# Patient Record
Sex: Female | Born: 1985 | Race: White | Hispanic: No | Marital: Married | Smoking: Current every day smoker
Health system: Southern US, Community
[De-identification: ages and names within clinical notes are randomized; demographics above are authoritative.]

## PROBLEM LIST (undated history)

## (undated) DIAGNOSIS — E079 Disorder of thyroid, unspecified: Secondary | ICD-10-CM

## (undated) DIAGNOSIS — N159 Renal tubulo-interstitial disease, unspecified: Secondary | ICD-10-CM

## (undated) HISTORY — PX: URETHRAL DILATION: SUR417

---
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
## 2006-09-01 ENCOUNTER — Emergency Department (HOSPITAL_COMMUNITY): Admission: EM | Admit: 2006-09-01 | Discharge: 2006-09-01 | Payer: Self-pay | Admitting: Emergency Medicine

## 2006-12-12 ENCOUNTER — Emergency Department (HOSPITAL_COMMUNITY): Admission: EM | Admit: 2006-12-12 | Discharge: 2006-12-12 | Payer: Self-pay | Admitting: Emergency Medicine

## 2006-12-23 ENCOUNTER — Emergency Department (HOSPITAL_COMMUNITY): Admission: EM | Admit: 2006-12-23 | Discharge: 2006-12-23 | Payer: Self-pay | Admitting: Emergency Medicine

## 2007-01-15 ENCOUNTER — Emergency Department (HOSPITAL_COMMUNITY): Admission: EM | Admit: 2007-01-15 | Discharge: 2007-01-15 | Payer: Self-pay | Admitting: Emergency Medicine

## 2007-01-23 ENCOUNTER — Emergency Department (HOSPITAL_COMMUNITY): Admission: EM | Admit: 2007-01-23 | Discharge: 2007-01-23 | Payer: Self-pay | Admitting: Emergency Medicine

## 2007-02-21 ENCOUNTER — Emergency Department (HOSPITAL_COMMUNITY): Admission: EM | Admit: 2007-02-21 | Discharge: 2007-02-21 | Payer: Self-pay | Admitting: Emergency Medicine

## 2007-05-27 ENCOUNTER — Emergency Department (HOSPITAL_COMMUNITY): Admission: EM | Admit: 2007-05-27 | Discharge: 2007-05-27 | Payer: Self-pay | Admitting: Emergency Medicine

## 2007-07-18 ENCOUNTER — Emergency Department (HOSPITAL_COMMUNITY): Admission: EM | Admit: 2007-07-18 | Discharge: 2007-07-18 | Payer: Self-pay | Admitting: Emergency Medicine

## 2007-09-23 ENCOUNTER — Emergency Department (HOSPITAL_COMMUNITY): Admission: EM | Admit: 2007-09-23 | Discharge: 2007-09-23 | Payer: Self-pay | Admitting: Emergency Medicine

## 2007-12-07 ENCOUNTER — Emergency Department (HOSPITAL_COMMUNITY): Admission: EM | Admit: 2007-12-07 | Discharge: 2007-12-07 | Payer: Self-pay | Admitting: Emergency Medicine

## 2008-03-12 ENCOUNTER — Emergency Department (HOSPITAL_COMMUNITY): Admission: EM | Admit: 2008-03-12 | Discharge: 2008-03-12 | Payer: Self-pay | Admitting: Emergency Medicine

## 2008-08-11 IMAGING — CR DG CHEST 2V
2 series · 2 of 2 positions shown · non-contrast
Comparison: None.

CLINICAL DATA: Cough, short of breath.
 CHEST ? 2 VIEW:

[view not recorded (1 of 2)]
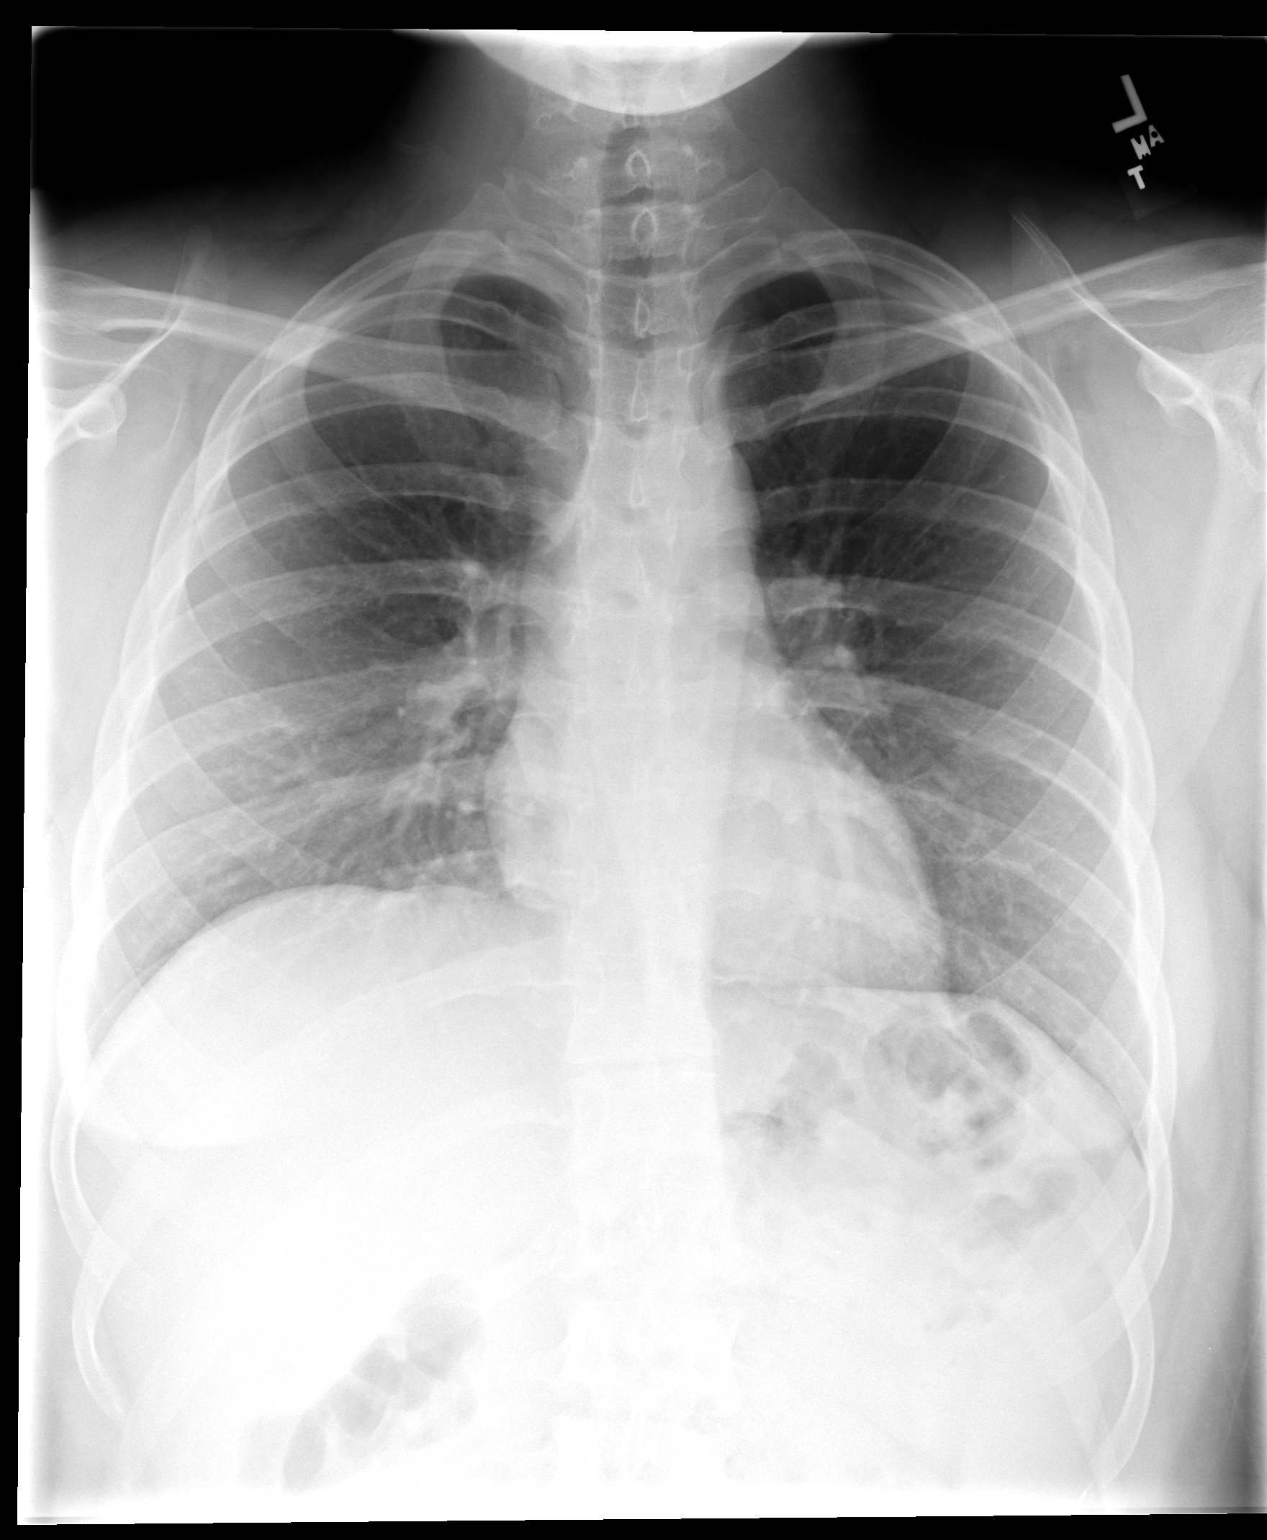

[view not recorded (2 of 2)]
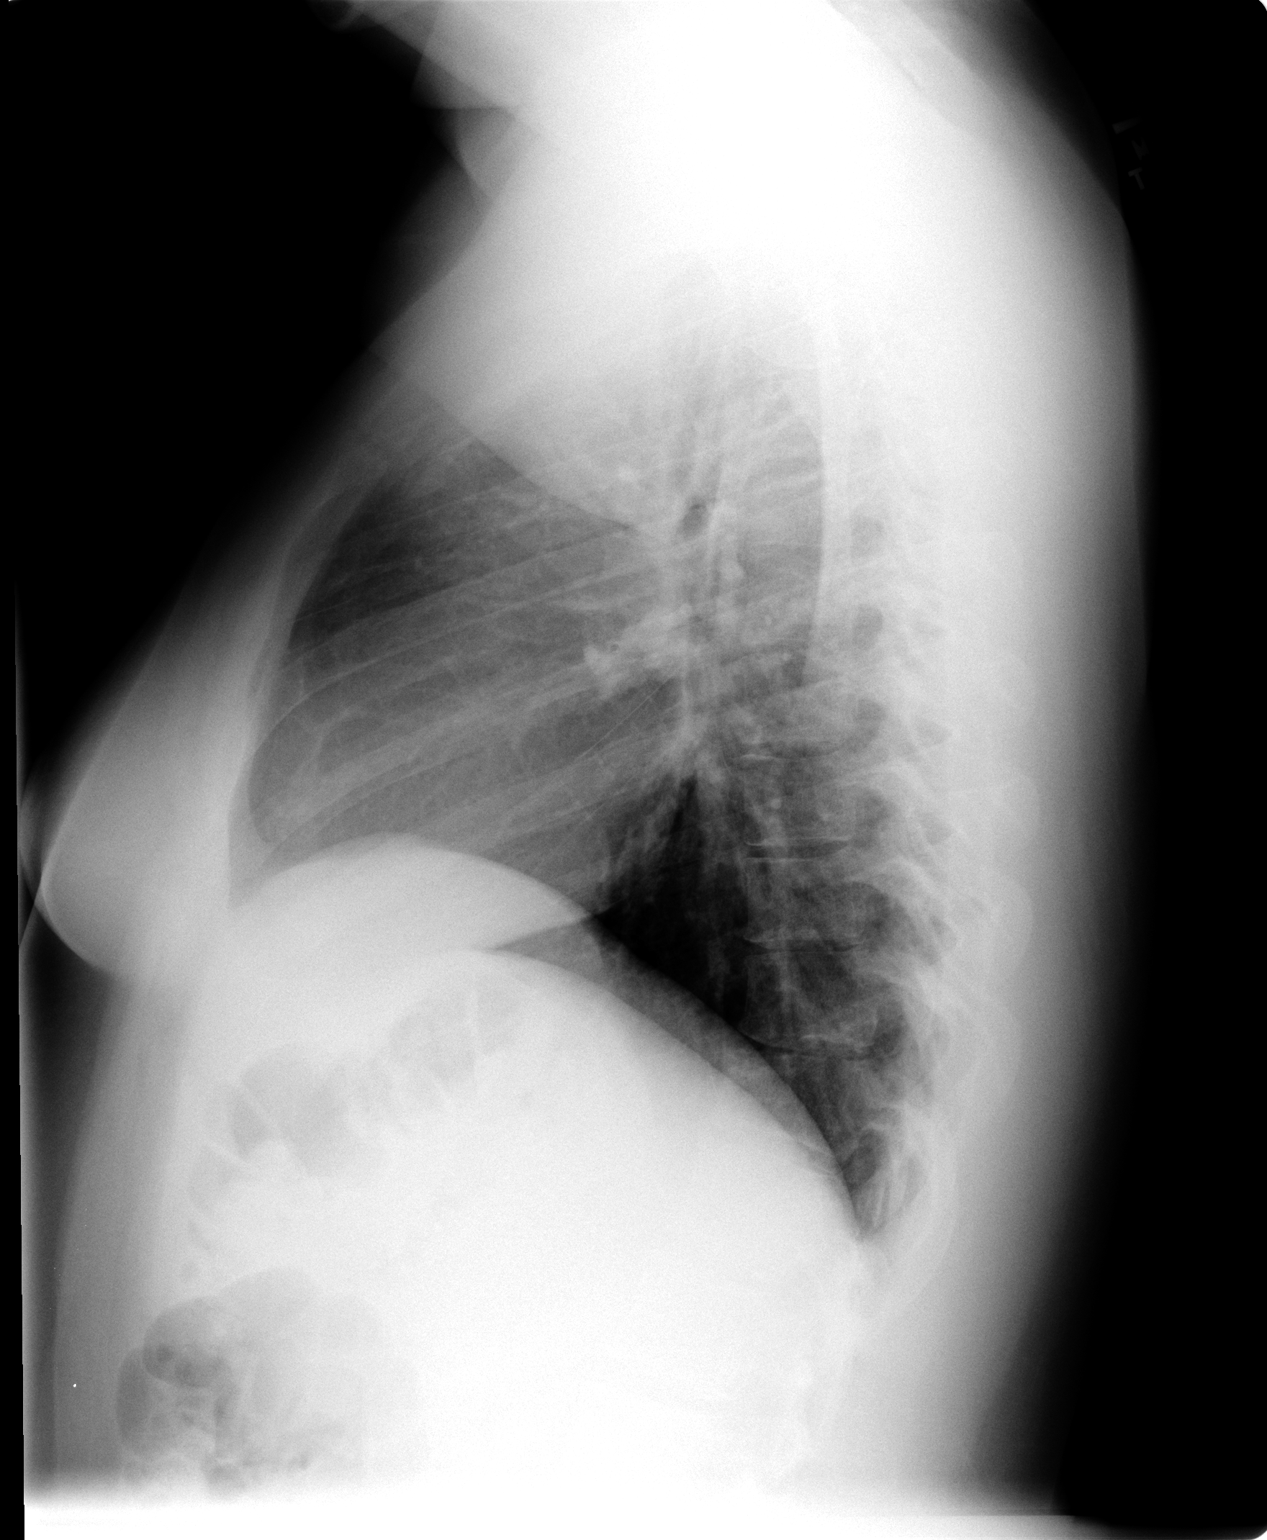

[2 of 2 positions shown; findings below may reference images not displayed]

FINDINGS: The heart size and mediastinal contours are within normal limits.  Both lungs are clear.  The visualized skeletal structures are unremarkable.
IMPRESSION: No active cardiopulmonary disease.

## 2009-01-23 ENCOUNTER — Emergency Department (HOSPITAL_COMMUNITY): Admission: EM | Admit: 2009-01-23 | Discharge: 2009-01-23 | Payer: Self-pay | Admitting: Emergency Medicine

## 2009-03-17 ENCOUNTER — Emergency Department (HOSPITAL_COMMUNITY): Admission: EM | Admit: 2009-03-17 | Discharge: 2009-03-17 | Payer: Self-pay | Admitting: Emergency Medicine

## 2009-03-22 ENCOUNTER — Emergency Department (HOSPITAL_COMMUNITY): Admission: EM | Admit: 2009-03-22 | Discharge: 2009-03-23 | Payer: Self-pay | Admitting: Emergency Medicine

## 2009-04-25 ENCOUNTER — Emergency Department (HOSPITAL_COMMUNITY): Admission: EM | Admit: 2009-04-25 | Discharge: 2009-04-25 | Payer: Self-pay | Admitting: Emergency Medicine

## 2009-04-28 ENCOUNTER — Emergency Department (HOSPITAL_COMMUNITY): Admission: EM | Admit: 2009-04-28 | Discharge: 2009-04-28 | Payer: Self-pay | Admitting: Emergency Medicine

## 2009-06-05 ENCOUNTER — Emergency Department (HOSPITAL_COMMUNITY): Admission: EM | Admit: 2009-06-05 | Discharge: 2009-06-05 | Payer: Self-pay | Admitting: Emergency Medicine

## 2009-09-19 ENCOUNTER — Emergency Department (HOSPITAL_COMMUNITY): Admission: EM | Admit: 2009-09-19 | Discharge: 2009-09-19 | Payer: Self-pay | Admitting: Emergency Medicine

## 2009-10-23 ENCOUNTER — Emergency Department (HOSPITAL_COMMUNITY): Admission: EM | Admit: 2009-10-23 | Discharge: 2009-10-23 | Payer: Self-pay | Admitting: Emergency Medicine

## 2009-11-14 ENCOUNTER — Emergency Department (HOSPITAL_COMMUNITY): Admission: EM | Admit: 2009-11-14 | Discharge: 2009-11-14 | Payer: Self-pay | Admitting: Emergency Medicine

## 2010-04-02 ENCOUNTER — Emergency Department (HOSPITAL_COMMUNITY): Admission: EM | Admit: 2010-04-02 | Discharge: 2010-04-02 | Payer: Self-pay | Admitting: Emergency Medicine

## 2010-05-12 ENCOUNTER — Emergency Department (HOSPITAL_COMMUNITY): Admission: EM | Admit: 2010-05-12 | Discharge: 2010-05-12 | Payer: Self-pay | Admitting: Emergency Medicine

## 2010-09-18 ENCOUNTER — Emergency Department (HOSPITAL_COMMUNITY): Admission: EM | Admit: 2010-09-18 | Discharge: 2010-09-18 | Payer: Self-pay | Admitting: Emergency Medicine

## 2011-01-31 ENCOUNTER — Encounter (HOSPITAL_COMMUNITY): Payer: Self-pay | Admitting: Radiology

## 2011-01-31 ENCOUNTER — Emergency Department (HOSPITAL_COMMUNITY)
Admission: EM | Admit: 2011-01-31 | Discharge: 2011-01-31 | Disposition: A | Discharge status: Home / Self Care | Payer: Managed Care, Other (non HMO) | Attending: Emergency Medicine | Admitting: Emergency Medicine

## 2011-01-31 ENCOUNTER — Emergency Department (HOSPITAL_COMMUNITY): Payer: Managed Care, Other (non HMO)

## 2011-01-31 DIAGNOSIS — J40 Bronchitis, not specified as acute or chronic: Secondary | ICD-10-CM | POA: Insufficient documentation

## 2011-01-31 LAB — CBC
HCT: 41.1 % (ref 36.0–46.0)
Hemoglobin: 13.7 g/dL (ref 12.0–15.0)
MCH: 26.8 pg (ref 26.0–34.0)
MCHC: 33.3 g/dL (ref 30.0–36.0)

## 2011-01-31 LAB — BASIC METABOLIC PANEL
BUN: 10 mg/dL (ref 6–23)
CO2: 26 mEq/L (ref 19–32)
Calcium: 8.7 mg/dL (ref 8.4–10.5)
Creatinine, Ser: 1.09 mg/dL (ref 0.4–1.2)
Glucose, Bld: 87 mg/dL (ref 70–99)

## 2011-01-31 LAB — DIFFERENTIAL
Basophils Relative: 0 % (ref 0–1)
Monocytes Absolute: 0.2 10*3/uL (ref 0.1–1.0)
Monocytes Relative: 6 % (ref 3–12)
Neutro Abs: 2.3 10*3/uL (ref 1.7–7.7)

## 2011-01-31 LAB — URINALYSIS, ROUTINE W REFLEX MICROSCOPIC: Urobilinogen, UA: 0.2 mg/dL (ref 0.0–1.0)

## 2011-02-02 LAB — URINE CULTURE
Colony Count: 25000
Culture  Setup Time: 201203020130

## 2011-03-06 LAB — URINALYSIS, ROUTINE W REFLEX MICROSCOPIC
Glucose, UA: NEGATIVE mg/dL
Hgb urine dipstick: NEGATIVE
Ketones, ur: NEGATIVE mg/dL
Protein, ur: NEGATIVE mg/dL

## 2011-03-07 LAB — CULTURE, ROUTINE-ABSCESS

## 2011-03-12 LAB — BASIC METABOLIC PANEL
BUN: 7 mg/dL (ref 6–23)
CO2: 27 mEq/L (ref 19–32)
Calcium: 9.1 mg/dL (ref 8.4–10.5)
Creatinine, Ser: 0.83 mg/dL (ref 0.4–1.2)
GFR calc Af Amer: >60 mL/min (ref >60)
Glucose, Bld: 109 mg/dL — ABNORMAL HIGH (ref 70–99)

## 2011-03-12 LAB — URINALYSIS, ROUTINE W REFLEX MICROSCOPIC
Glucose, UA: NEGATIVE mg/dL
Nitrite: NEGATIVE
Protein, ur: 100 mg/dL — AB

## 2011-03-12 LAB — URINE CULTURE: Colony Count: 40000

## 2011-03-12 LAB — CBC
MCHC: 35.6 g/dL (ref 30.0–36.0)
MCV: 80.4 fL (ref 78.0–100.0)
Platelets: 308 10*3/uL (ref 150–400)
RBC: 4.55 MIL/uL (ref 3.87–5.11)
RDW: 14.1 % (ref 11.5–15.5)

## 2011-03-12 LAB — DIFFERENTIAL
Basophils Absolute: 0.1 10*3/uL (ref 0.0–0.1)
Basophils Relative: 1 % (ref 0–1)
Eosinophils Absolute: 0.1 10*3/uL (ref 0.0–0.7)
Neutro Abs: 9.6 10*3/uL — ABNORMAL HIGH (ref 1.7–7.7)
Neutrophils Relative %: 79 % — ABNORMAL HIGH (ref 43–77)

## 2011-03-12 LAB — PREGNANCY, URINE: Preg Test, Ur: NEGATIVE

## 2011-03-12 LAB — URINE MICROSCOPIC-ADD ON

## 2011-03-13 LAB — RAPID STREP SCREEN (MED CTR MEBANE ONLY)
Streptococcus, Group A Screen (Direct): NEGATIVE
Streptococcus, Group A Screen (Direct): NEGATIVE

## 2011-03-19 LAB — URINALYSIS, ROUTINE W REFLEX MICROSCOPIC
Bilirubin Urine: NEGATIVE
Ketones, ur: NEGATIVE mg/dL
Nitrite: NEGATIVE
Urobilinogen, UA: 0.2 mg/dL (ref 0.0–1.0)

## 2011-03-19 LAB — PREGNANCY, URINE: Preg Test, Ur: NEGATIVE

## 2011-03-19 LAB — URINE CULTURE: Colony Count: 30000

## 2011-03-19 LAB — URINE MICROSCOPIC-ADD ON

## 2011-07-13 ENCOUNTER — Emergency Department (HOSPITAL_COMMUNITY)
Admission: EM | Admit: 2011-07-13 | Discharge: 2011-07-13 | Disposition: A | Discharge status: Home / Self Care | Payer: Managed Care, Other (non HMO) | Attending: Emergency Medicine | Admitting: Emergency Medicine

## 2011-07-13 ENCOUNTER — Encounter (HOSPITAL_COMMUNITY): Payer: Self-pay | Admitting: *Deleted

## 2011-07-13 DIAGNOSIS — Z87440 Personal history of urinary (tract) infections: Secondary | ICD-10-CM | POA: Insufficient documentation

## 2011-07-13 DIAGNOSIS — R109 Unspecified abdominal pain: Secondary | ICD-10-CM

## 2011-07-13 DIAGNOSIS — R1031 Right lower quadrant pain: Secondary | ICD-10-CM | POA: Insufficient documentation

## 2011-07-13 DIAGNOSIS — N159 Renal tubulo-interstitial disease, unspecified: Secondary | ICD-10-CM | POA: Insufficient documentation

## 2011-07-13 DIAGNOSIS — R10A1 Flank pain, right side: Secondary | ICD-10-CM

## 2011-07-13 DIAGNOSIS — R509 Fever, unspecified: Secondary | ICD-10-CM | POA: Insufficient documentation

## 2011-07-13 DIAGNOSIS — R300 Dysuria: Secondary | ICD-10-CM | POA: Insufficient documentation

## 2011-07-13 DIAGNOSIS — M549 Dorsalgia, unspecified: Secondary | ICD-10-CM | POA: Insufficient documentation

## 2011-07-13 HISTORY — DX: Renal tubulo-interstitial disease, unspecified: N15.9

## 2011-07-13 LAB — URINALYSIS, ROUTINE W REFLEX MICROSCOPIC
Bilirubin Urine: NEGATIVE
Glucose, UA: NEGATIVE mg/dL
Ketones, ur: NEGATIVE mg/dL
Leukocytes, UA: NEGATIVE
Nitrite: NEGATIVE
Protein, ur: NEGATIVE mg/dL
pH: 5.5 (ref 5.0–8.0)

## 2011-07-13 MED ORDER — ONDANSETRON HCL 4 MG PO TABS: 4.0000 mg | ORAL_TABLET | Freq: Four times a day (QID) | ORAL | Status: AC

## 2011-07-13 MED ORDER — HYDROCODONE-ACETAMINOPHEN 5-325 MG PO TABS: 1.0000 | ORAL_TABLET | ORAL | Status: AC | PRN

## 2011-07-13 MED ORDER — HYDROCODONE-ACETAMINOPHEN 5-325 MG PO TABS
2.0000 | ORAL_TABLET | Freq: Once | ORAL | Status: AC
  Administered 2011-07-13: 2 via ORAL
  Filled 2011-07-13: qty 2

## 2011-07-13 NOTE — ED Notes (Signed)
Fever, lower back pain, pain with urination that started Wednesday, nausea,

## 2011-07-13 NOTE — ED Notes (Signed)
I and O cath done, did have some difficulty  with procedure, pt tolerated well,

## 2011-07-13 NOTE — ED Provider Notes (Signed)
Scribed for No att. providers found, the patient was seen in room 5. This chart was scribed by Jannette Fogo. This patient's care was started at 12:50.    CSN: [id number] Arrival date & time: 07/13/2011 10:49 AM  Chief Complaint  Patient presents with  . Fever  . Back Pain  . Urinary Tract Infection   HPI Erika [name] is a 25 y.o. female with a history of multiple UTIs and pyelonephritis, who presents to the Emergency Department complaining of 4 days of right flank pain with associated nausea, chills, and subjective fever. Patient developed right flank pain at 03:00AM on Wednesday 07/10/11 and it has been constat since. She reports two days of nausea and diaphoresis at night. States flank pain and low pelvic pain is aggravated when she holds her urine. She also reports pain exacerbation with movement and ambulation. Patient denies any associates dysuria, urinary burning, stinging sensation, or vaginal discharge. She is currently on her menstrual period. There are no other associated symptoms and no other alleviating or aggravating factors.     Past Medical History  Diagnosis Date  . Asthma   . Infections of kidney   . Infections of kidney   . Kidney infection     Past Surgical History  Procedure Date  . Urethral dilation    MEDICATIONS:  Previous Medications   DULOXETINE (CYMBALTA) 60 MG CAPSULE    Take 60 mg by mouth daily.     IBUPROFEN (ADVIL,MOTRIN) 200 MG TABLET    Take 800 mg by mouth every 6 (six) hours as needed. For back pain    LEVOTHYROXINE (SYNTHROID, LEVOTHROID) 50 MCG TABLET    Take 50 mcg by mouth daily.     NAPROXEN SODIUM (ALEVE) 220 MG TABLET    Take 440 mg by mouth daily as needed. For headaches      ALLERGIES:  Allergies as of 07/13/2011 - Review Complete 07/13/2011  Allergen Reaction Noted  . Bee venom Swelling 07/13/2011     FAMILY HISTORY:  No Pertinent Family History   History  Substance Use Topics  . Smoking status: Current Everyday Smoker  .  Smokeless tobacco: Not on file  . Alcohol Use: No    Review of Systems  Constitutional: Fever: subjective   Genitourinary: Positive for flank pain, vaginal bleeding (on menses) and pelvic pain. Negative for dysuria, urgency, frequency, hematuria, decreased urine volume and vaginal discharge.  All other systems reviewed and are negative.    Physical Exam  BP 130/93  Pulse 96  Temp(Src) 98.5 F (36.9 C) (Oral)  Resp 19  Ht 5\' 4"  (1.626 m)  Wt 233 lb (105.688 kg)  BMI 39.99 kg/m2  SpO2 97%  LMP 07/12/2011  Physical Exam  Constitutional: She is oriented to person, place, and time. She appears well-developed and well-nourished. No distress.  HENT:  Head: Normocephalic and atraumatic.  Mouth/Throat: Oropharynx is clear and moist.  Eyes: Conjunctivae and EOM are normal. Pupils are equal, round, and reactive to light.  Neck: Normal range of motion. Neck supple.  Cardiovascular: Normal rate, regular rhythm, normal heart sounds and intact distal pulses.   No murmur heard. Pulmonary/Chest: Effort normal and breath sounds normal.  Abdominal: Soft. Bowel sounds are normal. She exhibits no distension. There is tenderness (Slight suprapubic tenderness).       Minimal right flank tenderness .   Musculoskeletal: Normal range of motion. She exhibits no edema and no tenderness.  Neurological: She is alert and oriented to person, place, and time. Coordination  normal.  Skin: Skin is warm and dry. No rash noted.  Psychiatric: She has a normal mood and affect. Her behavior is normal.    Procedures  OTHER DATA REVIEWED: Nursing notes, vital signs, and past medical records reviewed.   DIAGNOSTIC STUDIES: Oxygen Saturation is 98% on room air, normal by my interpretation.    LABS / RADIOLOGY:  Results for orders placed during the hospital encounter of 07/13/11  URINALYSIS, ROUTINE W REFLEX MICROSCOPIC      Component Value Range   Color, Urine YELLOW  YELLOW    Appearance CLEAR  CLEAR     Specific Gravity, Urine >1.030 (*) 1.005 - 1.030    pH 5.5  5.0 - 8.0    Glucose, UA NEGATIVE  NEGATIVE (mg/dL)   Hgb urine dipstick NEGATIVE  NEGATIVE    Bilirubin Urine NEGATIVE  NEGATIVE    Ketones, ur NEGATIVE  NEGATIVE (mg/dL)   Protein, ur NEGATIVE  NEGATIVE (mg/dL)   Urobilinogen, UA 0.2  0.0 - 1.0 (mg/dL)   Nitrite NEGATIVE  NEGATIVE    Leukocytes, UA NEGATIVE  NEGATIVE   PREGNANCY, URINE      Component Value Range   Preg Test, Ur NEGATIVE      ED COURSE / COORDINATION OF CARE: 12:55 - ED physician discussed urinalysis results with the patient  14:37 - Patient stable prior to discharge.      IMPRESSION: Right flank pain    PLAN:  Home  The patient is to return the emergency department if there is any worsening of symptoms. I have reviewed the discharge instructions with the patient.    CONDITION ON DISCHARGE: Stable   MEDICATIONS GIVEN IN THE E.D.  Medications  levothyroxine (SYNTHROID, LEVOTHROID) 50 MCG tablet (not administered)  DULoxetine (CYMBALTA) 60 MG capsule (not administered)  ibuprofen (ADVIL,MOTRIN) 200 MG tablet (not administered)  naproxen sodium (ALEVE) 220 MG tablet (not administered)  HYDROcodone-acetaminophen (NORCO) 5-325 MG per tablet (not administered)  ondansetron (ZOFRAN) 4 MG tablet (not administered)  HYDROcodone-acetaminophen (NORCO) 5-325 MG per tablet 2 tablet (2 tablet Oral Given 07/13/11 1338)     DISCHARGE MEDICATIONS: New Prescriptions   HYDROCODONE-ACETAMINOPHEN (NORCO) 5-325 MG PER TABLET    Take 1-2 tablets by mouth every 4 (four) hours as needed for pain.   ONDANSETRON (ZOFRAN) 4 MG TABLET    Take 1 tablet (4 mg total) by mouth every 6 (six) hours.         Donnetta Hutching, MD 07/15/11 [phone]

## 2011-07-13 NOTE — ED Notes (Signed)
MD at bedside. 

## 2011-07-13 NOTE — ED Notes (Signed)
See triage note.

## 2011-07-13 NOTE — ED Provider Notes (Signed)
History     CSN: [id number] Arrival date & time: 07/13/2011 10:49 AM  Chief Complaint  Patient presents with  . Fever  . Back Pain  . Urinary Tract Infection   HPI  Past Medical History  Diagnosis Date  . Asthma   . Infections of kidney   . Infections of kidney   . Kidney infection     Past Surgical History  Procedure Date  . Urethral dilation     History reviewed. No pertinent family history.  History  Substance Use Topics  . Smoking status: Current Everyday Smoker  . Smokeless tobacco: Not on file  . Alcohol Use: No    OB History    Grav Para Term Preterm Abortions TAB SAB Ect Mult Living                  Review of Systems  Physical Exam  BP 126/81  Pulse 100  Temp(Src) 98.5 F (36.9 C) (Oral)  Resp 20  Ht 5\' 4"  (1.626 m)  Wt 233 lb (105.688 kg)  BMI 39.99 kg/m2  SpO2 100%  LMP 07/12/2011  Physical Exam  ED Course  Procedures  MDMpt is nontoxic;  Urine clean;  She is in nad  I personally performed the services described in this documentation, which was scribed in my presence. The recorded information has been reviewed and considered.  Donnetta Hutching, MD 07/15/11 [phone]

## 2011-08-22 LAB — URINALYSIS, ROUTINE W REFLEX MICROSCOPIC
Bilirubin Urine: NEGATIVE
Glucose, UA: NEGATIVE
Ketones, ur: NEGATIVE
Nitrite: NEGATIVE
Protein, ur: NEGATIVE
Specific Gravity, Urine: >1.03 — ABNORMAL HIGH
pH: 5.5

## 2011-08-22 LAB — URINE MICROSCOPIC-ADD ON

## 2011-08-22 LAB — URINE CULTURE: Colony Count: 25000

## 2011-09-11 LAB — URINALYSIS, ROUTINE W REFLEX MICROSCOPIC
Bilirubin Urine: NEGATIVE
Glucose, UA: NEGATIVE
pH: 6

## 2011-10-03 ENCOUNTER — Emergency Department (HOSPITAL_COMMUNITY)
Admission: EM | Admit: 2011-10-03 | Discharge: 2011-10-03 | Disposition: A | Discharge status: Home / Self Care | Payer: Managed Care, Other (non HMO) | Attending: Emergency Medicine | Admitting: Emergency Medicine

## 2011-10-03 ENCOUNTER — Emergency Department (HOSPITAL_COMMUNITY): Payer: Managed Care, Other (non HMO)

## 2011-10-03 ENCOUNTER — Encounter (HOSPITAL_COMMUNITY): Payer: Self-pay | Admitting: Emergency Medicine

## 2011-10-03 DIAGNOSIS — F17200 Nicotine dependence, unspecified, uncomplicated: Secondary | ICD-10-CM | POA: Insufficient documentation

## 2011-10-03 DIAGNOSIS — E079 Disorder of thyroid, unspecified: Secondary | ICD-10-CM | POA: Insufficient documentation

## 2011-10-03 DIAGNOSIS — J45909 Unspecified asthma, uncomplicated: Secondary | ICD-10-CM | POA: Insufficient documentation

## 2011-10-03 DIAGNOSIS — M659 Synovitis and tenosynovitis, unspecified: Secondary | ICD-10-CM | POA: Insufficient documentation

## 2011-10-03 DIAGNOSIS — M65979 Unspecified synovitis and tenosynovitis, unspecified ankle and foot: Secondary | ICD-10-CM | POA: Insufficient documentation

## 2011-10-03 HISTORY — DX: Disorder of thyroid, unspecified: E07.9

## 2011-10-03 MED ORDER — HYDROCODONE-ACETAMINOPHEN 5-325 MG PO TABS
1.0000 | ORAL_TABLET | Freq: Once | ORAL | Status: AC
  Administered 2011-10-03: 1 via ORAL
  Filled 2011-10-03: qty 1

## 2011-10-03 MED ORDER — ONDANSETRON HCL 4 MG PO TABS
4.0000 mg | ORAL_TABLET | Freq: Once | ORAL | Status: AC
  Administered 2011-10-03: 4 mg via ORAL
  Filled 2011-10-03: qty 1

## 2011-10-03 MED ORDER — DEXAMETHASONE 6 MG PO TABS: ORAL_TABLET | ORAL | Status: AC

## 2011-10-03 MED ORDER — HYDROCODONE-ACETAMINOPHEN 5-325 MG PO TABS: 1.0000 | ORAL_TABLET | ORAL | Status: AC | PRN

## 2011-10-03 MED ORDER — DEXAMETHASONE SODIUM PHOSPHATE 4 MG/ML IJ SOLN
8.0000 mg | Freq: Once | INTRAMUSCULAR | Status: AC
  Administered 2011-10-03: 8 mg via INTRAMUSCULAR
  Filled 2011-10-03: qty 2

## 2011-10-03 NOTE — ED Provider Notes (Signed)
History     CSN: [id number] Arrival date & time: 10/03/2011 10:03 AM   First MD Initiated Contact with Patient 10/03/11 1001      Chief Complaint  Patient presents with  . Foot Pain    (Consider location/radiation/quality/duration/timing/severity/associated sxs/prior treatment) HPI Comments: No hx of recent injury. Injury about 1 month ago. Pt reports she has had problem since.  Patient is a 25 y.o. female presenting with lower extremity pain. The history is provided by the patient.  Foot Pain This is a recurrent problem. The current episode started in the past 7 days. The problem occurs daily. The problem has been gradually worsening. Associated symptoms include arthralgias. Pertinent negatives include no abdominal pain, chest pain, coughing or neck pain. The symptoms are aggravated by standing and walking. She has tried NSAIDs for the symptoms. The treatment provided no relief.    Past Medical History  Diagnosis Date  . Asthma   . Infections of kidney   . Infections of kidney   . Kidney infection   . Thyroid disease     Past Surgical History  Procedure Date  . Urethral dilation     No family history on file.  History  Substance Use Topics  . Smoking status: Current Everyday Smoker  . Smokeless tobacco: Not on file  . Alcohol Use: No    OB History    Grav Para Term Preterm Abortions TAB SAB Ect Mult Living                  Review of Systems  Constitutional: Negative for activity change.       All ROS Neg except as noted in HPI  HENT: Negative for nosebleeds and neck pain.   Eyes: Negative for photophobia and discharge.  Respiratory: Negative for cough, shortness of breath and wheezing.   Cardiovascular: Negative for chest pain and palpitations.  Gastrointestinal: Negative for abdominal pain and blood in stool.  Genitourinary: Negative for dysuria, frequency and hematuria.  Musculoskeletal: Positive for arthralgias. Negative for back pain.  Skin: Negative.    Neurological: Negative for dizziness, seizures and speech difficulty.  Psychiatric/Behavioral: Negative for hallucinations and confusion.    Allergies  Bee venom  Home Medications   Current Outpatient Rx  Name Route Sig Dispense Refill  . IBUPROFEN 200 MG PO TABS Oral Take 800 mg by mouth every 6 (six) hours as needed. For back pain     . LEVOTHYROXINE SODIUM 100 MCG PO TABS Oral Take 100 mcg by mouth daily.      Marland Kitchen NAPROXEN SODIUM 220 MG PO TABS Oral Take 440 mg by mouth daily as needed. For headaches     . DEXAMETHASONE 6 MG PO TABS  1 po bid with food 12 tablet 0  . HYDROCODONE-ACETAMINOPHEN 5-325 MG PO TABS Oral Take 1 tablet by mouth every 4 (four) hours as needed for pain. 20 tablet 0    BP 115/68  Pulse 90  Temp(Src) 97.3 F (36.3 C) (Oral)  Resp 18  Ht 5\' 4"  (1.626 m)  Wt 250 lb (113.399 kg)  BMI 42.91 kg/m2  SpO2 99%  LMP 09/14/2011  Physical Exam  Nursing note and vitals reviewed. Constitutional: She is oriented to person, place, and time. She appears well-developed and well-nourished.  Non-toxic appearance.  HENT:  Head: Normocephalic.  Right Ear: Tympanic membrane and external ear normal.  Left Ear: Tympanic membrane and external ear normal.  Eyes: EOM and lids are normal. Pupils are equal, round, and reactive to  light.  Neck: Normal range of motion. Neck supple. Carotid bruit is not present.  Cardiovascular: Normal rate, regular rhythm, normal heart sounds, intact distal pulses and normal pulses.   Pulmonary/Chest: Breath sounds normal. No respiratory distress.  Abdominal: Soft. Bowel sounds are normal. There is no tenderness. There is no guarding.  Musculoskeletal: Normal range of motion.       Pain with attempted ROM of the left ankle. Mild Lateral malleolus swelling. Crepitus noted. Achilles intact. Good cap refill and sensory.  Lymphadenopathy:       Head (right side): No submandibular adenopathy present.       Head (left side): No submandibular  adenopathy present.    She has no cervical adenopathy.  Neurological: She is alert and oriented to person, place, and time. She has normal strength. No cranial nerve deficit or sensory deficit.  Skin: Skin is warm and dry.  Psychiatric: She has a normal mood and affect. Her speech is normal.    ED Course  Procedures (including critical care time)  Labs Reviewed - No data to display Dg Ankle Complete Left  10/03/2011  *RADIOLOGY REPORT*  Clinical Data: Pain and swelling for 1 month, no known injury  LEFT ANKLE COMPLETE - 3+ VIEW  Comparison: None.  Findings: The ankle joint appears normal.  No acute fracture is seen.  Alignment is normal.  IMPRESSION: Negative.  Original Report Authenticated By: Juline Patch, M.D.     1. Tendonitis of ankle       MDM  I have reviewed nursing notes, vital signs, and all appropriate lab and imaging results for this patient.        Kathie Dike, [place] 10/03/11 1224

## 2011-10-03 NOTE — ED Notes (Signed)
Pt states left foot began hurting on Sunday.  Pt reports knot on foot x 1 month.  Pt reports swelling.  Alert and oriented x 4.  NAD.  Respirations even and unlabored.

## 2011-10-03 NOTE — ED Notes (Signed)
Pt alert and oriented.  Respirations even and unlabored.  Discharge instructions reviewed with pt and pt verbalized understanding.

## 2011-10-03 NOTE — ED Provider Notes (Signed)
Medical screening examination/treatment/procedure(s) were performed by non-physician practitioner and as supervising physician I was immediately available for consultation/collaboration.  Donnetta Hutching, MD 10/03/11 1248

## 2012-02-13 ENCOUNTER — Telehealth (HOSPITAL_COMMUNITY): Payer: Self-pay | Admitting: Dietician

## 2012-02-13 NOTE — Telephone Encounter (Signed)
Received referral from Dr. Margo Aye for dx: obesity on 02/07/2012.

## 2012-02-13 NOTE — Telephone Encounter (Signed)
Spoke with pt mother, Raynelle Fanning, during telephone encounter on 02/13/12 at 3:03 PM. She reports he daughter is not available, but will give pt RD contact information to pt so pt can call and schedule appointment. Provided RD contact information to pt mother.

## 2012-02-20 NOTE — Telephone Encounter (Signed)
Sent letter to pt home via [employer] Mail in attempt to contact pt to schedule appointment.  

## 2012-02-27 NOTE — Telephone Encounter (Signed)
Sent letter to pt home via [employer] Mail in attempt to contact pt to schedule appointment.  

## 2012-03-04 NOTE — Telephone Encounter (Signed)
Pt has not responded to attempts to contact to schedule appointment. Referral filed.  

## 2012-03-18 ENCOUNTER — Other Ambulatory Visit (HOSPITAL_COMMUNITY): Payer: Self-pay | Admitting: Internal Medicine

## 2012-03-18 DIAGNOSIS — M542 Cervicalgia: Secondary | ICD-10-CM

## 2012-03-18 DIAGNOSIS — M549 Dorsalgia, unspecified: Secondary | ICD-10-CM

## 2012-03-20 ENCOUNTER — Ambulatory Visit (HOSPITAL_COMMUNITY)
Admission: RE | Admit: 2012-03-20 | Discharge: 2012-03-20 | Disposition: A | Discharge status: Home / Self Care | Payer: Managed Care, Other (non HMO) | Source: Ambulatory Visit | Attending: Internal Medicine | Admitting: Internal Medicine

## 2012-03-20 DIAGNOSIS — M549 Dorsalgia, unspecified: Secondary | ICD-10-CM

## 2012-03-20 DIAGNOSIS — M542 Cervicalgia: Secondary | ICD-10-CM

## 2012-03-20 DIAGNOSIS — M5020 Other cervical disc displacement, unspecified cervical region: Secondary | ICD-10-CM | POA: Insufficient documentation

## 2013-05-31 ENCOUNTER — Encounter (HOSPITAL_COMMUNITY): Payer: Self-pay | Admitting: *Deleted

## 2013-05-31 ENCOUNTER — Emergency Department (HOSPITAL_COMMUNITY)
Admission: EM | Admit: 2013-05-31 | Discharge: 2013-05-31 | Disposition: A | Discharge status: Home / Self Care | Payer: Self-pay | Attending: Emergency Medicine | Admitting: Emergency Medicine

## 2013-05-31 ENCOUNTER — Emergency Department (HOSPITAL_COMMUNITY): Payer: Self-pay

## 2013-05-31 DIAGNOSIS — E079 Disorder of thyroid, unspecified: Secondary | ICD-10-CM | POA: Insufficient documentation

## 2013-05-31 DIAGNOSIS — Z87448 Personal history of other diseases of urinary system: Secondary | ICD-10-CM | POA: Insufficient documentation

## 2013-05-31 DIAGNOSIS — F17200 Nicotine dependence, unspecified, uncomplicated: Secondary | ICD-10-CM | POA: Insufficient documentation

## 2013-05-31 DIAGNOSIS — R0602 Shortness of breath: Secondary | ICD-10-CM | POA: Insufficient documentation

## 2013-05-31 DIAGNOSIS — J45909 Unspecified asthma, uncomplicated: Secondary | ICD-10-CM | POA: Insufficient documentation

## 2013-05-31 DIAGNOSIS — J209 Acute bronchitis, unspecified: Secondary | ICD-10-CM | POA: Insufficient documentation

## 2013-05-31 DIAGNOSIS — Z79899 Other long term (current) drug therapy: Secondary | ICD-10-CM | POA: Insufficient documentation

## 2013-05-31 DIAGNOSIS — J310 Chronic rhinitis: Secondary | ICD-10-CM

## 2013-05-31 DIAGNOSIS — J40 Bronchitis, not specified as acute or chronic: Secondary | ICD-10-CM

## 2013-05-31 DIAGNOSIS — J309 Allergic rhinitis, unspecified: Secondary | ICD-10-CM | POA: Insufficient documentation

## 2013-05-31 DIAGNOSIS — R0789 Other chest pain: Secondary | ICD-10-CM | POA: Insufficient documentation

## 2013-05-31 MED ORDER — PREDNISONE 20 MG PO TABS: ORAL_TABLET | ORAL | Status: DC

## 2013-05-31 MED ORDER — AMOXICILLIN 500 MG PO CAPS: 500.0000 mg | ORAL_CAPSULE | Freq: Three times a day (TID) | ORAL | Status: DC

## 2013-05-31 NOTE — ED Notes (Signed)
Patient with no complaints at this time. Respirations even and unlabored. Skin warm/dry. Discharge instructions reviewed with patient at this time. Patient given opportunity to voice concerns/ask questions. Patient discharged at this time and left Emergency Department with steady gait.   

## 2013-05-31 NOTE — ED Notes (Signed)
Non productive cough x 3 days.  Denies fever.

## 2013-05-31 NOTE — ED Notes (Signed)
Sinus congestion x 1 week. 3 days ago began having sore throat in AM w/non-productive cough.  Has been using Benadryl PM and Sudafed in AM + Albuterol inhaler w/out relief of symptoms.  States she can't take Mucinex d/t size of pill.

## 2013-05-31 NOTE — ED Provider Notes (Signed)
History    This chart was scribed for Ward Givens, MD by Donne Anon, ED Scribe. This patient was seen in room APA08/APA08 and the patient's care was started at 0722.  CSN: [id number] Arrival date & time 05/31/13  0715  First MD Initiated Contact with Patient 05/31/13 [phone]     Chief Complaint  Patient presents with  . Cough    The history is provided by the patient. No language interpreter was used.   HPI Comments: Erika [name] is a 27 y.o. female with hx of asthma, who presents to the Emergency Department complaining of 3 days of a gradual onset, gradually worsening, constant, productive cough with white/yellow sputum and associated yellow/green nasal congestion. She reports associated sore throat, SOB, wheezing and chest tightness. She denies fever, nausea, vomiting or any other pain. She has had to use an albuterol in the past and has used one for this complaint, and reports moderate relief. She has also tried Financial controller. She was last on a steroid 1 year ago for her asthma. 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 Marland Kitchen   She currently smokes 1/2 pack of cigarettes per day but denies alcohol use.    Dr. Margo Aye is her PCP.   Past Medical History  Diagnosis Date  . Asthma   . Infections of kidney   . Infections of kidney   . Kidney infection   . Thyroid disease    Past Surgical History  Procedure Laterality Date  . Urethral dilation     No family history on file. History  Substance Use Topics  . Smoking status: Current Every Day Smoker    Types: Cigarettes  . Smokeless tobacco: Not on file  . Alcohol Use: No   employed  OB History   Grav Para Term Preterm Abortions TAB SAB Ect Mult Living                 Review of Systems  Constitutional: Negative for fever.  Respiratory: Positive for cough, chest tightness, shortness of breath and wheezing.   Gastrointestinal: Negative  for nausea and vomiting.    Allergies  Bee venom  Home Medications   Current Outpatient Rx  Name  Route  Sig  Dispense  Refill  . ibuprofen (ADVIL,MOTRIN) 200 MG tablet   Oral   Take 800 mg by mouth every 6 (six) hours as needed. For back pain          . levothyroxine (SYNTHROID, LEVOTHROID) 100 MCG tablet   Oral   Take 100 mcg by mouth daily.           . naproxen sodium (ALEVE) 220 MG tablet   Oral   Take 440 mg by mouth daily as needed. For headaches           BP 124/83  Pulse 88  Temp(Src) 98.3 F (36.8 C) (Oral)  Resp 18  SpO2 97%  LMP 05/03/2013  Vital signs normal    Physical Exam  Nursing note and vitals reviewed. Constitutional: She is oriented to person, place, and time. She appears well-developed and well-nourished.  Non-toxic appearance. She does not appear ill. No distress.  HENT:  Head: Normocephalic and atraumatic.  Right Ear: External ear normal.  Left Ear: External ear normal.  Nose: Nose normal. No mucosal edema or rhinorrhea.  Mouth/Throat: Oropharynx is clear and moist and mucous membranes are normal. No dental  abscesses or edematous.  Eyes: Conjunctivae and EOM are normal. Pupils are equal, round, and reactive to light.  Neck: Normal range of motion and full passive range of motion without pain. Neck supple.  Cardiovascular: Normal rate, regular rhythm and normal heart sounds.  Exam reveals no gallop and no friction rub.   No murmur heard. Pulmonary/Chest: Effort normal. No respiratory distress. She has decreased breath sounds. She has no wheezes. She has no rhonchi. She has no rales. She exhibits no tenderness and no crepitus.  Diminished breath sounds  Abdominal: Soft. Normal appearance and bowel sounds are normal. She exhibits no distension. There is no tenderness. There is no rebound and no guarding.  Musculoskeletal: Normal range of motion. She exhibits no edema and no tenderness.  Moves all extremities well.   Neurological: She is  alert and oriented to person, place, and time. She has normal strength. No cranial nerve deficit.  Skin: Skin is warm, dry and intact. No rash noted. No erythema. No pallor.  Psychiatric: She has a normal mood and affect. Her speech is normal and behavior is normal. Her mood appears not anxious.    ED Course  Procedures (including critical care time) DIAGNOSTIC STUDIES: Oxygen Saturation is 97% on RA, adequate by my interpretation.    COORDINATION OF CARE: 7:55 AM Discussed treatment plan which includes Prednisone and amoxicillin with pt at bedside and pt agreed to plan. Advised pt to use inhaler and Muscinex D. Will discharge home.   Dg Chest 2 View  05/31/2013   *RADIOLOGY REPORT*  Clinical Data: Cough, congestion for 4 days, history of smoking and asthma  CHEST - 2 VIEW  Comparison: Chest x-ray of 01/31/2011  Findings: No active infiltrate or effusion is seen.  Minimal peribronchial thickening is present.  Mediastinal contours appear stable.  The heart is within normal limits in size.  No bony abnormality is seen.  IMPRESSION: Stable chest x-ray.  Mild peribronchial thickening.   Original Report Authenticated By: Dwyane Dee, M.D.    1. Bronchitis   2. Rhinitis     Discharge Medication List as of 05/31/2013  8:04 AM    START taking these medications   Details  amoxicillin (AMOXIL) 500 MG capsule Take 1 capsule (500 mg total) by mouth 3 (three) times daily., Starting 05/31/2013, Until Discontinued, Print    predniSONE (DELTASONE) 20 MG tablet Take 3 po QD x 3d , then 2 po QD x 3d then 1 po QD x 3d, Print        Plan discharge   Devoria Albe, MD, FACEP    MDM   I personally performed the services described in this documentation, which was scribed in my presence. The recorded information has been reviewed and considered.  Devoria Albe, MD, Armando Gang    Ward Givens, MD 05/31/13 [phone]

## 2014-05-21 ENCOUNTER — Emergency Department (HOSPITAL_COMMUNITY): Payer: Self-pay

## 2014-05-21 ENCOUNTER — Encounter (HOSPITAL_COMMUNITY): Payer: Self-pay | Admitting: Emergency Medicine

## 2014-05-21 ENCOUNTER — Emergency Department (HOSPITAL_COMMUNITY)
Admission: EM | Admit: 2014-05-21 | Discharge: 2014-05-21 | Disposition: A | Discharge status: Home / Self Care | Payer: Self-pay | Attending: Emergency Medicine | Admitting: Emergency Medicine

## 2014-05-21 DIAGNOSIS — Z87448 Personal history of other diseases of urinary system: Secondary | ICD-10-CM | POA: Insufficient documentation

## 2014-05-21 DIAGNOSIS — Y929 Unspecified place or not applicable: Secondary | ICD-10-CM | POA: Insufficient documentation

## 2014-05-21 DIAGNOSIS — W208XXA Other cause of strike by thrown, projected or falling object, initial encounter: Secondary | ICD-10-CM | POA: Insufficient documentation

## 2014-05-21 DIAGNOSIS — J45909 Unspecified asthma, uncomplicated: Secondary | ICD-10-CM | POA: Insufficient documentation

## 2014-05-21 DIAGNOSIS — Z791 Long term (current) use of non-steroidal anti-inflammatories (NSAID): Secondary | ICD-10-CM | POA: Insufficient documentation

## 2014-05-21 DIAGNOSIS — S60221A Contusion of right hand, initial encounter: Secondary | ICD-10-CM

## 2014-05-21 DIAGNOSIS — S6000XA Contusion of unspecified finger without damage to nail, initial encounter: Secondary | ICD-10-CM | POA: Insufficient documentation

## 2014-05-21 DIAGNOSIS — Z79899 Other long term (current) drug therapy: Secondary | ICD-10-CM | POA: Insufficient documentation

## 2014-05-21 DIAGNOSIS — Y9389 Activity, other specified: Secondary | ICD-10-CM | POA: Insufficient documentation

## 2014-05-21 DIAGNOSIS — F17200 Nicotine dependence, unspecified, uncomplicated: Secondary | ICD-10-CM | POA: Insufficient documentation

## 2014-05-21 DIAGNOSIS — E079 Disorder of thyroid, unspecified: Secondary | ICD-10-CM | POA: Insufficient documentation

## 2014-05-21 MED ORDER — MELOXICAM 7.5 MG PO TABS: ORAL_TABLET | ORAL | Status: DC

## 2014-05-21 MED ORDER — TRAMADOL HCL 50 MG PO TABS: ORAL_TABLET | ORAL | Status: DC

## 2014-05-21 NOTE — ED Provider Notes (Signed)
Medical screening examination/treatment/procedure(s) were performed by non-physician practitioner and as supervising physician I was immediately available for consultation/collaboration.   EKG Interpretation None        Joseph L Zammit, MD 05/21/14 1930 

## 2014-05-21 NOTE — ED Notes (Signed)
Right index finger smashed, hit by falling can from cabinet

## 2014-05-21 NOTE — Discharge Instructions (Signed)
Your x-ray is negative for fracture or dislocation. Your examination is negative for any neurologic or vascular compromise. Suspect that you have a deep bruise involving your hand and in particular your index finger. Please keep the hand above your heart. Please apply ice today and tomorrow. Please use Mobic 2 times daily with food. May use with Ultram for more severe pain. This medication may cause drowsiness, please use with caution. Please see Dr. Margo AyeHall for additional evaluation and management if not improving. Hand Contusion A hand contusion is a deep bruise on your hand area. Contusions are the result of an injury that caused bleeding under the skin. The contusion may turn blue, purple, or yellow. Minor injuries will give you a painless contusion, but more severe contusions may stay painful and swollen for a few weeks. CAUSES  A contusion is usually caused by a blow, trauma, or direct force to an area of the body. SYMPTOMS   Swelling and redness of the injured area.  Discoloration of the injured area.  Tenderness and soreness of the injured area.  Pain. DIAGNOSIS  The diagnosis can be made by taking a history and performing a physical exam. An X-ray, CT scan, or MRI may be needed to determine if there were any associated injuries, such as broken bones (fractures). TREATMENT  Often, the best treatment for a hand contusion is resting, elevating, icing, and applying cold compresses to the injured area. Over-the-counter medicines may also be recommended for pain control. HOME CARE INSTRUCTIONS   Put ice on the injured area.  Put ice in a plastic bag.  Place a towel between your skin and the bag.  Leave the ice on for 15-20 minutes, 03-04 times a day.  Only take over-the-counter or prescription medicines as directed by your caregiver. Your caregiver may recommend avoiding anti-inflammatory medicines (aspirin, ibuprofen, and naproxen) for 48 hours because these medicines may increase  bruising.  If told, use an elastic wrap as directed. This can help reduce swelling. You may remove the wrap for sleeping, showering, and bathing. If your fingers become numb, cold, or blue, take the wrap off and reapply it more loosely.  Elevate your hand with pillows to reduce swelling.  Avoid overusing your hand if it is painful. SEEK IMMEDIATE MEDICAL CARE IF:   You have increased redness, swelling, or pain in your hand.  Your swelling or pain is not relieved with medicines.  You have loss of feeling in your hand or are unable to move your fingers.  Your hand turns cold or blue.  You have pain when you move your fingers.  Your hand becomes warm to the touch.  Your contusion does not improve in 2 days. MAKE SURE YOU:   Understand these instructions.  Will watch your condition.  Will get help right away if you are not doing well or get worse. Document Released: 05/10/2002 Document Revised: 08/12/2012 Document Reviewed: 05/11/2012 [facility] JerseyExit[facility] Patient Information 2015 [place], [place]. This information is not intended to replace advice given to you by your health care provider. Make sure you discuss any questions you have with your health care provider.

## 2014-05-21 NOTE — ED Provider Notes (Signed)
CSN: [id number]     Arrival date & time 05/21/14  1720 History   First MD Initiated Contact with Patient 05/21/14 1727     Chief Complaint  Patient presents with  . Finger Injury     (Consider location/radiation/quality/duration/timing/severity/associated sxs/prior Treatment) HPI Comments: Patient is a 28 year old female who presents to the emergency department with a complaint of right index finger pain. The patient states that a large can of spaghetti sauce fell on her right hand in particular the right index finger. This happened about 10:30 in the morning. The patient states that she felt she probably just had a bruise, but the swelling began to be noted not only at the finger but extending into the web space between the index finger and thumb. The patient came in to make sure she did not have a fracture or dislocation.  Patient is a 28 y.o. female presenting with hand pain. The history is provided by the patient.  Hand Pain This is a new problem. The current episode started today. Pertinent negatives include no abdominal pain, arthralgias, chest pain, coughing or neck pain.    Past Medical History  Diagnosis Date  . Asthma   . Infections of kidney   . Infections of kidney   . Kidney infection   . Thyroid disease    Past Surgical History  Procedure Laterality Date  . Urethral dilation     History reviewed. No pertinent family history. History  Substance Use Topics  . Smoking status: Current Every Day Smoker -- 1.00 packs/day    Types: Cigarettes  . Smokeless tobacco: Not on file  . Alcohol Use: No   OB History   Grav Para Term Preterm Abortions TAB SAB Ect Mult Living                 Review of Systems  Constitutional: Negative for activity change.       All ROS Neg except as noted in HPI  HENT: Negative for nosebleeds.   Eyes: Negative for photophobia and discharge.  Respiratory: Negative for cough, shortness of breath and wheezing.   Cardiovascular: Negative for  chest pain and palpitations.  Gastrointestinal: Negative for abdominal pain and blood in stool.  Genitourinary: Negative for dysuria, frequency and hematuria.  Musculoskeletal: Negative for arthralgias, back pain and neck pain.  Skin: Negative.   Neurological: Negative for dizziness, seizures and speech difficulty.  Psychiatric/Behavioral: Negative for hallucinations and confusion.      Allergies  Bee venom  Home Medications   Prior to Admission medications   Medication Sig Start Date End Date Taking? Authorizing Provider  ibuprofen (ADVIL,MOTRIN) 200 MG tablet Take 800 mg by mouth every 6 (six) hours as needed. For back pain    Yes Historical Provider, MD  levothyroxine (SYNTHROID, LEVOTHROID) 100 MCG tablet Take 100 mcg by mouth daily.     Yes Historical Provider, MD  naproxen sodium (ALEVE) 220 MG tablet Take 440 mg by mouth daily as needed. For headaches     Historical Provider, MD   BP 126/82  Pulse 114  Temp(Src) 98.6 F (37 C) (Oral)  Resp 17  SpO2 96%  LMP 05/14/2014 Physical Exam  Nursing note and vitals reviewed. Constitutional: She is oriented to person, place, and time. She appears well-developed and well-nourished.  Non-toxic appearance.  HENT:  Head: Normocephalic.  Right Ear: Tympanic membrane and external ear normal.  Left Ear: Tympanic membrane and external ear normal.  Eyes: EOM and lids are normal. Pupils are equal, round,  and reactive to light.  Neck: Normal range of motion. Neck supple. Carotid bruit is not present.  Cardiovascular: Normal rate, regular rhythm, normal heart sounds, intact distal pulses and normal pulses.   Pulmonary/Chest: Breath sounds normal. No respiratory distress.  Abdominal: Soft. Bowel sounds are normal. There is no tenderness. There is no guarding.  Musculoskeletal: Normal range of motion.  There is full range of motion of the right shoulder, elbow, and wrist. There is mild soreness but no deformity or excessive swelling  involving the anatomical snuff box on the right. There is good range of motion of the thumb, but with some soreness. There is mild-to-moderate swelling of the right index finger. More at the base than at the distal tip. Capillary refill is less than 2 seconds.  Lymphadenopathy:       Head (right side): No submandibular adenopathy present.       Head (left side): No submandibular adenopathy present.    She has no cervical adenopathy.  Neurological: She is alert and oriented to person, place, and time. She has normal strength. No cranial nerve deficit or sensory deficit.  Skin: Skin is warm and dry.  Psychiatric: She has a normal mood and affect. Her speech is normal.    ED Course  Procedures (including critical care time) Labs Review Labs Reviewed - No data to display  Imaging Review Dg Finger Index Right  05/21/2014   CLINICAL DATA:  Dropped a can of spaghetti sauce onto RIGHT index finger, finger injury  EXAM: RIGHT INDEX FINGER 2+V  COMPARISON:  RIGHT hand radiographs 05/12/2010.  FINDINGS: Osseous mineralization normal.  Mild diffuse soft tissue swelling.  Joint spaces preserved.  No acute fracture, dislocation or bone destruction.  IMPRESSION: No acute osseous abnormalities.   Electronically Signed   By: Ulyses SouthwardMark  Boles M.D.   On: 05/21/2014 18:03     EKG Interpretation None      MDM X-ray of the right index finger is negative for fracture or dislocation. There no neurovascular changes or compromise appreciated. Suspect the patient has a deep bruise involving this area. The plan at this time is for the patient to keep the hand elevated above the heart, apply ice, and use ibuprofen for mild pain, Ultram for more severe pain.    Final diagnoses:  None    *I have reviewed nursing notes, vital signs, and all appropriate lab and imaging results for this patient.Kathie Dike**    Hobson M Bryant, PA-C 05/21/14 1824

## 2014-07-20 ENCOUNTER — Encounter (HOSPITAL_COMMUNITY): Payer: Self-pay | Admitting: Emergency Medicine

## 2014-07-20 ENCOUNTER — Emergency Department (HOSPITAL_COMMUNITY)
Admission: EM | Admit: 2014-07-20 | Discharge: 2014-07-20 | Disposition: A | Discharge status: Home / Self Care | Payer: Self-pay | Attending: Emergency Medicine | Admitting: Emergency Medicine

## 2014-07-20 ENCOUNTER — Emergency Department (HOSPITAL_COMMUNITY): Payer: Self-pay

## 2014-07-20 DIAGNOSIS — Z87440 Personal history of urinary (tract) infections: Secondary | ICD-10-CM | POA: Insufficient documentation

## 2014-07-20 DIAGNOSIS — J45909 Unspecified asthma, uncomplicated: Secondary | ICD-10-CM | POA: Insufficient documentation

## 2014-07-20 DIAGNOSIS — Z79899 Other long term (current) drug therapy: Secondary | ICD-10-CM | POA: Insufficient documentation

## 2014-07-20 DIAGNOSIS — F17200 Nicotine dependence, unspecified, uncomplicated: Secondary | ICD-10-CM | POA: Insufficient documentation

## 2014-07-20 DIAGNOSIS — M5412 Radiculopathy, cervical region: Secondary | ICD-10-CM | POA: Insufficient documentation

## 2014-07-20 DIAGNOSIS — E079 Disorder of thyroid, unspecified: Secondary | ICD-10-CM | POA: Insufficient documentation

## 2014-07-20 DIAGNOSIS — M25519 Pain in unspecified shoulder: Secondary | ICD-10-CM | POA: Insufficient documentation

## 2014-07-20 DIAGNOSIS — R209 Unspecified disturbances of skin sensation: Secondary | ICD-10-CM | POA: Insufficient documentation

## 2014-07-20 MED ORDER — OXYCODONE-ACETAMINOPHEN 5-325 MG PO TABS: 1.0000 | ORAL_TABLET | ORAL | Status: DC | PRN

## 2014-07-20 MED ORDER — OXYCODONE-ACETAMINOPHEN 5-325 MG PO TABS
2.0000 | ORAL_TABLET | Freq: Once | ORAL | Status: AC
  Administered 2014-07-20: 2 via ORAL
  Filled 2014-07-20: qty 2

## 2014-07-20 NOTE — ED Notes (Signed)
Burning type pain to left shoulder, denies injury

## 2014-07-20 NOTE — ED Provider Notes (Signed)
CSN: [id number]     Arrival date & time 07/20/14  0043 History   First MD Initiated Contact with Patient 07/20/14 0148     Chief Complaint  Patient presents with  . Shoulder Pain      Patient is a 28 y.o. female presenting with shoulder pain. The history is provided by the patient.  Shoulder Pain This is a new problem. The current episode started 2 days ago. The problem occurs constantly. The problem has been gradually worsening. Pertinent negatives include no chest pain, no headaches and no shortness of breath. Exacerbated by: movement. The symptoms are relieved by rest.  pt reports onset of left neck/shoulder burning type pain two days ago Worse with palpation/movement No trauma but she reports heavy lifting recently at work She reports numbness to her left fingers only She reports due to pain she can not raise her left shoulder She denies h/o neck surgery previously  Past Medical History  Diagnosis Date  . Asthma   . Infections of kidney   . Infections of kidney   . Kidney infection   . Thyroid disease    Past Surgical History  Procedure Laterality Date  . Urethral dilation     No family history on file. History  Substance Use Topics  . Smoking status: Current Every Day Smoker -- 1.00 packs/day    Types: Cigarettes  . Smokeless tobacco: Not on file  . Alcohol Use: No   OB History   Grav Para Term Preterm Abortions TAB SAB Ect Mult Living                 Review of Systems  Constitutional: Negative for fever.  Respiratory: Negative for shortness of breath.   Cardiovascular: Negative for chest pain.  Neurological: Positive for numbness. Negative for headaches.      Allergies  Bee venom  Home Medications   Prior to Admission medications   Medication Sig Start Date End Date Taking? Authorizing Provider  ibuprofen (ADVIL,MOTRIN) 200 MG tablet Take 800 mg by mouth every 6 (six) hours as needed. For back pain    Yes Historical Provider, MD  levothyroxine  (SYNTHROID, LEVOTHROID) 100 MCG tablet Take 100 mcg by mouth daily.     Yes Historical Provider, MD  oxyCODONE-acetaminophen (PERCOCET/ROXICET) 5-325 MG per tablet Take 1 tablet by mouth every 4 (four) hours as needed for severe pain. 07/20/14   Joya Gaskinsonald W Maximina Pirozzi, MD  traMADol Janean Sark(ULTRAM) 50 MG tablet 1 or 2 po q6h prn pain 05/21/14   Kathie DikeHobson M Bryant, PA-C   BP 107/72  Pulse 88  Temp(Src) 98.3 F (36.8 C) (Oral)  Resp 16  Ht 5\' 4"  (1.626 m)  Wt 228 lb (103.42 kg)  BMI 39.12 kg/m2  SpO2 99%  LMP 07/04/2014 Physical Exam CONSTITUTIONAL: Well developed/well nourished HEAD: Normocephalic/atraumatic EYES: EOMI/PERRL ENMT: Mucous membranes moist NECK: supple no meningeal signs SPINE:entire spine nontender, cervical paraspinal tenderness.   CV: S1/S2 noted, no murmurs/rubs/gallops noted LUNGS: Lungs are clear to auscultation bilaterally, no apparent distress ABDOMEN: soft, nontender, no rebound or guarding GU:no cva tenderness NEURO: Pt is awake/alert, moves all extremitiesx4 Equal power (5/5) with hand grip, wrist flex/extension, elbow flex/extension,pt reports left fingers "tingling" but no other sensory deficit to left UE Equal (2+) biceps/brachioradialis reflex in bilateral UE Left shoulder abduction limited due to pain.   EXTREMITIES: pulses normal, full ROM, tenderness to palpation of left trapezius No warmth or erythema noted No deformity noted to left shoulder SKIN: warm, color normal PSYCH: no  abnormalities of mood noted  ED Course  Procedures  Suspect possible cervical radiculopathy I don't feel she needs emergent MRI at this time Advised pain control and f/u as outpatient We discussed strict return precautions  Imaging Review Dg Shoulder Left  07/20/2014   CLINICAL DATA:  Left shoulder pain.  EXAM: LEFT SHOULDER - 2+ VIEW  COMPARISON:  None.  FINDINGS: The joint spaces are maintained. No acute fracture. The ribs are intact. The left lung is clear.  IMPRESSION: No fracture  or dislocation.   Electronically Signed   By: Loralie Champagne M.D.   On: 07/20/2014 01:40      MDM   Final diagnoses:  Cervical radiculopathy    Nursing notes including past medical history and social history reviewed and considered in documentation xrays reviewed and considered     Joya Gaskins, MD 07/20/14 [phone]

## 2014-07-20 NOTE — Discharge Instructions (Signed)

## 2016-03-28 ENCOUNTER — Emergency Department (HOSPITAL_COMMUNITY): Payer: BLUE CROSS/BLUE SHIELD

## 2016-03-28 ENCOUNTER — Encounter (HOSPITAL_COMMUNITY): Payer: Self-pay | Admitting: Emergency Medicine

## 2016-03-28 ENCOUNTER — Emergency Department (HOSPITAL_COMMUNITY)
Admission: EM | Admit: 2016-03-28 | Discharge: 2016-03-29 | Disposition: A | Discharge status: Home / Self Care | Payer: BLUE CROSS/BLUE SHIELD | Attending: Emergency Medicine | Admitting: Emergency Medicine

## 2016-03-28 DIAGNOSIS — L03211 Cellulitis of face: Secondary | ICD-10-CM | POA: Diagnosis not present

## 2016-03-28 DIAGNOSIS — Z791 Long term (current) use of non-steroidal anti-inflammatories (NSAID): Secondary | ICD-10-CM | POA: Insufficient documentation

## 2016-03-28 DIAGNOSIS — Z79891 Long term (current) use of opiate analgesic: Secondary | ICD-10-CM | POA: Diagnosis not present

## 2016-03-28 DIAGNOSIS — F17210 Nicotine dependence, cigarettes, uncomplicated: Secondary | ICD-10-CM | POA: Insufficient documentation

## 2016-03-28 DIAGNOSIS — K122 Cellulitis and abscess of mouth: Secondary | ICD-10-CM | POA: Insufficient documentation

## 2016-03-28 DIAGNOSIS — J45909 Unspecified asthma, uncomplicated: Secondary | ICD-10-CM | POA: Insufficient documentation

## 2016-03-28 DIAGNOSIS — M272 Inflammatory conditions of jaws: Secondary | ICD-10-CM

## 2016-03-28 DIAGNOSIS — R600 Localized edema: Secondary | ICD-10-CM | POA: Diagnosis present

## 2016-03-28 LAB — I-STAT CHEM 8, ED
BUN: 12 mg/dL (ref 6–20)
CALCIUM ION: 1.25 mmol/L — AB (ref 1.12–1.23)
CHLORIDE: 103 mmol/L (ref 101–111)
CREATININE: 1 mg/dL (ref 0.44–1.00)
GLUCOSE: 92 mg/dL (ref 65–99)
HCT: 39 % (ref 36.0–46.0)
Hemoglobin: 13.3 g/dL (ref 12.0–15.0)
POTASSIUM: 3.5 mmol/L (ref 3.5–5.1)
Sodium: 142 mmol/L (ref 135–145)
TCO2: 25 mmol/L (ref 0–100)

## 2016-03-28 MED ORDER — MORPHINE SULFATE (PF) 4 MG/ML IV SOLN
4.0000 mg | INTRAVENOUS | Status: DC | PRN
  Administered 2016-03-28: 4 mg via INTRAVENOUS
  Filled 2016-03-28: qty 1

## 2016-03-28 MED ORDER — CLINDAMYCIN HCL 300 MG PO CAPS: 300.0000 mg | ORAL_CAPSULE | Freq: Three times a day (TID) | ORAL | Status: DC

## 2016-03-28 MED ORDER — DEXAMETHASONE SODIUM PHOSPHATE 10 MG/ML IJ SOLN
10.0000 mg | Freq: Once | INTRAMUSCULAR | Status: AC
  Administered 2016-03-28: 10 mg via INTRAVENOUS
  Filled 2016-03-28: qty 1

## 2016-03-28 MED ORDER — IOPAMIDOL (ISOVUE-300) INJECTION 61%
100.0000 mL | Freq: Once | INTRAVENOUS | Status: AC | PRN
  Administered 2016-03-28: 100 mL via INTRAVENOUS

## 2016-03-28 MED ORDER — ONDANSETRON HCL 4 MG/2ML IJ SOLN
4.0000 mg | Freq: Once | INTRAMUSCULAR | Status: AC
  Administered 2016-03-28: 4 mg via INTRAVENOUS
  Filled 2016-03-28: qty 2

## 2016-03-28 MED ORDER — PREDNISONE 20 MG PO TABS: 20.0000 mg | ORAL_TABLET | Freq: Two times a day (BID) | ORAL | Status: DC

## 2016-03-28 MED ORDER — CLINDAMYCIN PHOSPHATE 900 MG/50ML IV SOLN
900.0000 mg | Freq: Once | INTRAVENOUS | Status: AC
  Administered 2016-03-28: 900 mg via INTRAVENOUS
  Filled 2016-03-28: qty 50

## 2016-03-28 NOTE — ED Provider Notes (Addendum)
CSN: [id number]     Arrival date & time 03/28/16  1936 History  By signing my name below, I, Tanda Rockers, attest that this documentation has been prepared under the direction and in the presence of Rolland Porter, MD. Electronically Signed: Tanda Rockers, ED Scribe. 03/28/2016. 9:51 PM.   Chief Complaint  Patient presents with  . Oral Swelling   The history is provided by the patient. No language interpreter was used.     HPI Comments: Erika [name] is a 30 y.o. female who presents to the Emergency Department complaining of gradual onset, constant, worsening, oral and facial swelling x 2 weeks after having wisdom teeth removed. Pt reports that 2 weeks ago she had the left and right lower wisdom teeth removed. Since the surgery she has had a knot to the left jaw that has been gradually increasing in size. Pt was seen by her oral surgeon yesterday and was diagnosed with cellulitis. She was placed on Augment and told to go to the ED if the size increased or she began having sore throat. Pt currently complains of sore throat with difficulty swallowing. Denies voice change, fever, chills, or any other associated symptoms.   Past Medical History  Diagnosis Date  . Asthma   . Infections of kidney   . Infections of kidney   . Kidney infection   . Thyroid disease    Past Surgical History  Procedure Laterality Date  . Urethral dilation    . Cesarean section     No family history on file. Social History  Substance Use Topics  . Smoking status: Current Every Day Smoker -- 0.50 packs/day    Types: Cigarettes  . Smokeless tobacco: None  . Alcohol Use: No   OB History    No data available     Review of Systems  Constitutional: Negative for fever, chills, diaphoresis, appetite change and fatigue.  HENT: Positive for dental problem, facial swelling, sore throat and trouble swallowing. Negative for mouth sores.   Eyes: Negative for visual disturbance.  Respiratory: Negative for cough, chest  tightness, shortness of breath and wheezing.   Cardiovascular: Negative for chest pain.  Gastrointestinal: Negative for nausea, vomiting, abdominal pain, diarrhea and abdominal distention.  Endocrine: Negative for polydipsia, polyphagia and polyuria.  Genitourinary: Negative for dysuria, frequency and hematuria.  Musculoskeletal: Negative for gait problem.  Skin: Negative for color change, pallor and rash.  Neurological: Negative for dizziness, syncope, light-headedness and headaches.  Hematological: Does not bruise/bleed easily.  Psychiatric/Behavioral: Negative for behavioral problems and confusion.      Allergies  Bee venom  Home Medications   Prior to Admission medications   Medication Sig Start Date End Date Taking? Authorizing Provider  ALPRAZolam Prudy Feeler) 0.5 MG tablet Take 0.5 mg by mouth daily. 03/08/16  Yes Historical Provider, MD  amoxicillin-clavulanate (AUGMENTIN) 875-125 MG tablet Take 1 tablet by mouth 2 (two) times daily. 7 day course starting on 03/27/16   Yes Historical Provider, MD  HYDROcodone-acetaminophen (NORCO/VICODIN) 5-325 MG tablet Take 1 tablet by mouth every 6 (six) hours as needed. 03/27/16  Yes Historical Provider, MD  ibuprofen (ADVIL,MOTRIN) 600 MG tablet Take 1 tablet by mouth every 6 (six) hours as needed for mild pain or moderate pain.  03/14/16  Yes Historical Provider, MD  levothyroxine (SYNTHROID, LEVOTHROID) 100 MCG tablet Take 100 mcg by mouth daily.     Yes Historical Provider, MD  clindamycin (CLEOCIN) 300 MG capsule Take 1 capsule (300 mg total) by mouth 3 (three) times  daily. 03/28/16   Rolland PorterMark Ferris Tally, MD  predniSONE (DELTASONE) 20 MG tablet Take 1 tablet (20 mg total) by mouth 2 (two) times daily with a meal. 03/28/16   Rolland PorterMark Baby Stairs, MD   BP 120/78 mmHg  Pulse 76  Temp(Src) 98.8 F (37.1 C) (Oral)  Resp 17  Ht 5\' 5"  (1.651 m)  Wt 293 lb (132.904 kg)  BMI 48.76 kg/m2  SpO2 100%  LMP  (LMP Unknown) Physical Exam  Constitutional: She is oriented to  person, place, and time. She appears well-developed and well-nourished. No distress.  HENT:  Head: Normocephalic.  Swelling underneath the ramus of the mandible to the frontline.  Trismus present.  Tongue not elevated.  Floor mouth soft.  Posterior pharynx normal.  Uvula midline.   Eyes: Conjunctivae are normal. Pupils are equal, round, and reactive to light. No scleral icterus.  Neck: Normal range of motion. Neck supple. No thyromegaly present.  Cardiovascular: Normal rate and regular rhythm.  Exam reveals no gallop and no friction rub.   No murmur heard. Pulmonary/Chest: Effort normal and breath sounds normal. No respiratory distress. She has no wheezes. She has no rales.  Abdominal: Soft. Bowel sounds are normal. She exhibits no distension. There is no tenderness. There is no rebound.  Musculoskeletal: Normal range of motion.  Neurological: She is alert and oriented to person, place, and time.  Skin: Skin is warm and dry. No rash noted.  Psychiatric: She has a normal mood and affect. Her behavior is normal.    ED Course  Procedures (including critical care time)  DIAGNOSTIC STUDIES: Oxygen Saturation is 100% on RA, normal by my interpretation.    COORDINATION OF CARE: 9:50 PM-Discussed treatment plan with pt at bedside and pt agreed to plan.   Labs Review Labs Reviewed  I-STAT CHEM 8, ED - Abnormal; Notable for the following:    Calcium, Ion 1.25 (*)    All other components within normal limits    Imaging Review Ct Soft Tissue Neck W Contrast  03/28/2016  CLINICAL DATA:  Gradual onset facial swelling for 2 weeks after wisdom teeth extraction. Diagnosed with cellulitis, on antibiotics. Sore throat and difficulty swallowing. EXAM: CT NECK WITH CONTRAST TECHNIQUE: Multidetector CT imaging of the neck was performed using the standard protocol following the bolus administration of intravenous contrast. CONTRAST:  100mL ISOVUE-300 IOPAMIDOL (ISOVUE-300) INJECTION 61% COMPARISON:   MRI of the cervical spine March 20, 2012 FINDINGS: Pharynx and larynx: Normal appearance of the pharynx and larynx including the true vocal cords. Salivary glands: Normal. Thyroid: Normal. Lymph nodes: 12 mm short access LEFT level IIa reniform lymph node, with additional scattered prominent though not pathologically enlarged lymph nodes. Vascular: Normal. Limited intracranial: Normal.  Small posterior fossa arachnoid cyst. Visualized orbits: Normal. Mastoids and visualized paranasal sinuses: Minimal RIGHT maxillary floor mucosal thickening associated with periapical lucency/oral antral fistula. Mastoid air cells are well aerated. Skeleton/soft tissues: LEFT lower facial soft tissue swelling, subcutaneous fat stranding, thickened platysma extending to RIGHT lower face. Superimposed 7 x 18 mm (transverse by AP) rim enhancing fluid collection within buccal surface of the LEFT mandible soft tissues. Mildly enlarged LEFT masseter muscle. Status post recent bilateral mandible molar extraction. No destructive bony lesions. Broad reversed cervical lordosis with moderate C5-6 disc height loss uncovertebral hypertrophy compatible with degenerative disc. Mild similar canal stenosis at C5-6. Upper chest: Lung apices are clear. No superior mediastinal lymphadenopathy. IMPRESSION: LEFT greater than RIGHT lower facial cellulitis with superimposed 7 x 18 mm LEFT mandible soft tissue  abscess. Enlarged LEFT masseter muscle compatible with myositis. Patent airway. Status post recent bilateral mandible molar extraction. 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 Mild lymphadenopathy is likely reactive. Electronically Signed   By: Awilda Metro M.D.   On: 03/28/2016 23:06   I have personally reviewed and evaluated these images and lab results as part of my medical decision-making.   EKG Interpretation None      MDM   Final diagnoses:  Facial cellulitis  Mandibular abscess    on recheck  his incisors alters that she symptoms improved.  She is able to swallow.  No difficulty with breathing.  CT scan showed no rise on her airway.  Patient treatment. Incision and drainage of the primary mandibular abscess with drainage of purulence.  Given IV echo drawn, clindamycin, morphine, Zofran.  We'll follow-up with her oral surgeon with focal tomorrow.  Will change from amoxicillin to clindamycin.  Add prednisone. INCISION AND DRAINAGE Performed by: Claudean Kinds Consent: Verbal consent obtained. Risks and benefits: risks, benefits and alternatives were discussed Type: abscess  Body area: Lt outer peri-mandibular soft tissue  Anesthesia: local infiltration  Incision was made with a scalpel.  Local anesthetic: lidocaine 1% c epinephrine  Anesthetic total: 2 ml  Complexity: complex Blunt dissection to break up loculations  Drainage: purulent  Drainage amount: alight, pruulent  Packing material: 1 None  Patient tolerance: Patient tolerated the procedure well with no immediate complications.     I personally performed the services described in this documentation, which was scribed in my presence. The recorded information has been reviewed and is accurate.      Rolland Porter, MD 03/28/16 1610  Rolland Porter, MD 03/28/16 [phone]

## 2016-03-28 NOTE — ED Notes (Signed)
Pt states that she is being treated for cellulitis after having wisdom teeth removed, started feeling like she was SOB and difficulty swallowing about 1 hour ago

## 2016-03-28 NOTE — Discharge Instructions (Signed)
Call your oral surgeon for recheck appointment tomorrow.   Cellulitis Cellulitis is an infection of the skin and the tissue beneath it. The infected area is usually red and tender. Cellulitis occurs most often in the arms and lower legs.  CAUSES  Cellulitis is caused by bacteria that enter the skin through cracks or cuts in the skin. The most common types of bacteria that cause cellulitis are staphylococci and streptococci. SIGNS AND SYMPTOMS   Redness and warmth.  Swelling.  Tenderness or pain.  Fever. DIAGNOSIS  Your health care provider can usually determine what is wrong based on a physical exam. Blood tests may also be done. TREATMENT  Treatment usually involves taking an antibiotic medicine. HOME CARE INSTRUCTIONS   Take your antibiotic medicine as directed by your health care provider. Finish the antibiotic even if you start to feel better.  Keep the infected arm or leg elevated to reduce swelling.  Apply a warm cloth to the affected area up to 4 times per day to relieve pain.  Take medicines only as directed by your health care provider.  Keep all follow-up visits as directed by your health care provider. SEEK MEDICAL CARE IF:   You notice red streaks coming from the infected area.  Your red area gets larger or turns dark in color.  Your bone or joint underneath the infected area becomes painful after the skin has healed.  Your infection returns in the same area or another area.  You notice a swollen bump in the infected area.  You develop new symptoms.  You have a fever. SEEK IMMEDIATE MEDICAL CARE IF:   You feel very sleepy.  You develop vomiting or diarrhea.  You have a general ill feeling (malaise) with muscle aches and pains.   This information is not intended to replace advice given to you by your health care provider. Make sure you discuss any questions you have with your health care provider.   Document Released: 08/28/2005 Document Revised:  08/09/2015 Document Reviewed: 02/03/2012 Elsevier Interactive Patient Education [employer]2016 [employer]

## 2016-05-31 ENCOUNTER — Encounter (HOSPITAL_COMMUNITY): Payer: Self-pay

## 2016-05-31 ENCOUNTER — Emergency Department (HOSPITAL_COMMUNITY)
Admission: EM | Admit: 2016-05-31 | Discharge: 2016-05-31 | Disposition: A | Discharge status: Home / Self Care | Payer: BLUE CROSS/BLUE SHIELD | Attending: Emergency Medicine | Admitting: Emergency Medicine

## 2016-05-31 DIAGNOSIS — X58XXXA Exposure to other specified factors, initial encounter: Secondary | ICD-10-CM | POA: Diagnosis not present

## 2016-05-31 DIAGNOSIS — T63441A Toxic effect of venom of bees, accidental (unintentional), initial encounter: Secondary | ICD-10-CM | POA: Insufficient documentation

## 2016-05-31 DIAGNOSIS — J45909 Unspecified asthma, uncomplicated: Secondary | ICD-10-CM | POA: Diagnosis not present

## 2016-05-31 DIAGNOSIS — Y929 Unspecified place or not applicable: Secondary | ICD-10-CM | POA: Insufficient documentation

## 2016-05-31 DIAGNOSIS — Y999 Unspecified external cause status: Secondary | ICD-10-CM | POA: Diagnosis not present

## 2016-05-31 DIAGNOSIS — S70361A Insect bite (nonvenomous), right thigh, initial encounter: Secondary | ICD-10-CM | POA: Diagnosis present

## 2016-05-31 DIAGNOSIS — F17210 Nicotine dependence, cigarettes, uncomplicated: Secondary | ICD-10-CM | POA: Diagnosis not present

## 2016-05-31 DIAGNOSIS — Y939 Activity, unspecified: Secondary | ICD-10-CM | POA: Insufficient documentation

## 2016-05-31 MED ORDER — DIPHENHYDRAMINE HCL 25 MG PO CAPS
25.0000 mg | ORAL_CAPSULE | Freq: Once | ORAL | Status: AC
  Administered 2016-05-31: 25 mg via ORAL
  Filled 2016-05-31: qty 1

## 2016-05-31 MED ORDER — SULFAMETHOXAZOLE-TRIMETHOPRIM 800-160 MG PO TABS
1.0000 | ORAL_TABLET | Freq: Two times a day (BID) | ORAL | Status: AC
Start: 2016-05-31 — End: 2016-06-07

## 2016-05-31 MED ORDER — PREDNISONE 20 MG PO TABS: 40.0000 mg | ORAL_TABLET | Freq: Every day | ORAL | Status: DC

## 2016-05-31 MED ORDER — IBUPROFEN 800 MG PO TABS: 800.0000 mg | ORAL_TABLET | Freq: Three times a day (TID) | ORAL | Status: DC

## 2016-05-31 MED ORDER — DEXAMETHASONE SODIUM PHOSPHATE 4 MG/ML IJ SOLN
10.0000 mg | Freq: Once | INTRAMUSCULAR | Status: AC
  Administered 2016-05-31: 10 mg via INTRAMUSCULAR
  Filled 2016-05-31: qty 3

## 2016-05-31 MED ORDER — IBUPROFEN 800 MG PO TABS
800.0000 mg | ORAL_TABLET | Freq: Once | ORAL | Status: AC
  Administered 2016-05-31: 800 mg via ORAL
  Filled 2016-05-31: qty 1

## 2016-05-31 NOTE — ED Notes (Signed)
Bee sting to right thigh yesterday.

## 2016-05-31 NOTE — Discharge Instructions (Signed)
Bee, Wasp, or [employer], wasps, and hornets are part of a family of insects that can sting people. These stings can cause pain and inflammation, but they are usually not serious. However, some people may have an allergic reaction to a sting. This can cause the symptoms to be more severe.  SYMPTOMS  Common symptoms of this condition include:   A red lump in the skin that sometimes has a tiny hole in the center. In some cases, a stinger may be in the center of the wound.  Pain and itching at the sting site.  Redness and swelling around the sting site. If you have an allergic reaction (localized allergic reaction), the swelling and redness may spread out from the sting site. In some cases, this reaction can continue to develop over the next 12-36 hours. In rare cases, a person may have a severe allergic reaction (anaphylactic reaction) to a sting. Symptoms of an anaphylactic reaction may include:   Wheezing or difficulty breathing.  Raised, itchy, red patches on the skin.  Nausea or vomiting.  Abdominal cramping.  Diarrhea.  Chest pain.  Fainting.  Redness of the face (flushing). DIAGNOSIS  This condition is usually diagnosed based on symptoms, medical history, and a physical exam. TREATMENT  Most stings can be treated with:   Icing to reduce swelling.  Medicines (antihistamines) to treat itching or an allergic reaction.  Medicines to help reduce pain. These may be medicines that you take by mouth, or medicated creams or lotions that you apply to your skin. If you were stung by a bee, the stinger and a small sac of poison may be in the wound. This may be removed by brushing across it with a flat card, such as a credit card. Another method is to pinch the area and pull it out. These methods can help reduce the severity of the body's reaction to the sting.  HOME CARE INSTRUCTIONS   Wash the sting site daily with soap and water as told by your health care provider.  Apply  or take over-the-counter and prescription medicines only as told by your health care provider.  If directed, apply ice to the sting area.  Put ice in a plastic bag.  Place a towel between your skin and the bag.  Leave the ice on for 20 minutes, 2-3 times per day.  Do not scratch the sting area.  To lessen pain, try using a paste that is made of water and baking soda. Rub the paste on the sting area and leave it on for 5 minutes.  If you had a severe allergic reaction to a sting, you may need:  To wear a medical bracelet or necklace that lists the allergy.  To learn when and how to use an anaphylaxis kit or epinephrine injection. Your family members may also need to learn this.  To carry an anaphylaxis kit with you at all times. SEEK MEDICAL CARE IF:   Your symptoms do not get better in 2-3 days.  You have redness, swelling, or pain that spreads beyond the area of the sting.  You have a fever. SEEK IMMEDIATE MEDICAL CARE IF:  You have symptoms of a severe allergic reaction. These include:   Wheezing or difficulty breathing.  Chest pain.  Light-headedness or fainting.  Itchy, raised, red patches on the skin.  Nausea or vomiting.  Abdominal cramping.  Diarrhea.   This information is not intended to replace advice given to you by your health care provider.  Make sure you discuss any questions you have with your health care provider. °  °Document Released: 11/18/2005 Document Revised: 08/09/2015 Document Reviewed: 04/05/2015 °Elsevier Interactive Patient Education ©2016 [employer] ° °

## 2016-05-31 NOTE — ED Provider Notes (Signed)
CSN: [id number]     Arrival date & time 05/31/16  2053 History   First MD Initiated Contact with Patient 05/31/16 2102     Chief Complaint  Patient presents with  . Insect Bite     (Consider location/radiation/quality/duration/timing/severity/associated sxs/prior Treatment) HPI   Erika [name] is a 30 y.o. female who presents to the Emergency Department complaining of redness and pain to her right anterior thigh after a bee sting.  She states was stung by a large black bee and  believes it was a hornet.  Occurred one day prior to arrival.  She reports increasing redness to the area and pain with walking.  She has taken benadryl earlier without relief.  She denies fever, chills, swelling, and difficulty breathing or swallowing. Also denies previous anaphylactic reactions    Past Medical History  Diagnosis Date  . Asthma   . Infections of kidney   . Infections of kidney   . Kidney infection   . Thyroid disease    Past Surgical History  Procedure Laterality Date  . Urethral dilation    . Cesarean section     History reviewed. No pertinent family history. Social History  Substance Use Topics  . Smoking status: Current Every Day Smoker -- 1.00 packs/day    Types: Cigarettes  . Smokeless tobacco: None  . Alcohol Use: No   OB History    No data available     Review of Systems  Constitutional: Negative for fever, chills, activity change and appetite change.  HENT: Negative for facial swelling, sore throat and trouble swallowing.   Respiratory: Negative for chest tightness, shortness of breath and wheezing.   Musculoskeletal: Negative for neck pain and neck stiffness.  Skin: Positive for color change. Negative for wound.       Bee sting right thigh  Neurological: Negative for dizziness, weakness, numbness and headaches.  All other systems reviewed and are negative.     Allergies  Bee venom  Home Medications   Prior to Admission medications   Medication Sig Start  Date End Date Taking? Authorizing Provider  ALPRAZolam Prudy Feeler(XANAX) 0.5 MG tablet Take 0.5 mg by mouth daily. 03/08/16   Historical Provider, MD  amoxicillin-clavulanate (AUGMENTIN) 875-125 MG tablet Take 1 tablet by mouth 2 (two) times daily. 7 day course starting on 03/27/16    Historical Provider, MD  clindamycin (CLEOCIN) 300 MG capsule Take 1 capsule (300 mg total) by mouth 3 (three) times daily. 03/28/16   Rolland PorterMark James, MD  HYDROcodone-acetaminophen (NORCO/VICODIN) 5-325 MG tablet Take 1 tablet by mouth every 6 (six) hours as needed. 03/27/16   Historical Provider, MD  ibuprofen (ADVIL,MOTRIN) 600 MG tablet Take 1 tablet by mouth every 6 (six) hours as needed for mild pain or moderate pain.  03/14/16   Historical Provider, MD  levothyroxine (SYNTHROID, LEVOTHROID) 100 MCG tablet Take 100 mcg by mouth daily.      Historical Provider, MD  predniSONE (DELTASONE) 20 MG tablet Take 1 tablet (20 mg total) by mouth 2 (two) times daily with a meal. 03/28/16   Rolland PorterMark James, MD   BP 113/74 mmHg  Pulse 107  Temp(Src) 98.4 F (36.9 C) (Oral)  Resp 18  Ht 5\' 4"  (1.626 m)  Wt 131.543 kg  BMI 49.75 kg/m2  SpO2 98% Physical Exam  Constitutional: She is oriented to person, place, and time. She appears well-developed and well-nourished. No distress.  HENT:  Head: Normocephalic and atraumatic.  Mouth/Throat: Oropharynx is clear and moist.  Neck: Normal  range of motion. Neck supple.  Cardiovascular: Normal rate, regular rhythm, normal heart sounds and intact distal pulses.   No murmur heard. Pulmonary/Chest: Effort normal and breath sounds normal. No respiratory distress.  Musculoskeletal: She exhibits no edema or tenderness.  Lymphadenopathy:    She has no cervical adenopathy.  Neurological: She is alert and oriented to person, place, and time. She exhibits normal muscle tone. Coordination normal.  Skin: Skin is warm. There is erythema.  17 x 18 cm macular area of erythema to distal right upper leg.  Moderate  warmth and induration.  No fluctuance  Nursing note and vitals reviewed.   ED Course  Procedures (including critical care time) Labs Review Labs Reviewed - No data to display  Imaging Review No results found. I have personally reviewed and evaluated these images and lab results as part of my medical decision-making.   EKG Interpretation None      MDM   Final diagnoses:  Bee sting reaction, accidental or unintentional, initial encounter   Pt well appearing.  Localized area of erythema to anterior thigh w/o abscess.  Pt agrees to warm compresses, ER return if not improving.    Leading edge of erythema marked by me.  Treated here with IM decadron, benadryl and ibuprofen.     Pauline Ausammy Arlayne Liggins, PA-C 06/02/16 1232  Vanetta MuldersScott Zackowski, MD 06/03/16 2219

## 2017-11-26 IMAGING — CT CT NECK W/ CM
3 series · 13 of 33 positions shown, 16 images · IV contrast (Omnipaque 300)
Comparison: MRI of the cervical spine March 20, 2012

CLINICAL DATA: Gradual onset facial swelling for 2 weeks after
wisdom teeth extraction. Diagnosed with cellulitis, on antibiotics.
Sore throat and difficulty swallowing.

EXAM:
CT NECK WITH CONTRAST
TECHNIQUE: Multidetector CT imaging of the neck was performed using the
standard protocol following the bolus administration of intravenous
contrast.
CONTRAST:  100mL 2P3IRA-E11 IOPAMIDOL (2P3IRA-E11) INJECTION 61%

[Series 2: soft tissue neck 2.0 b31s · axial · 0.57mm/px · z∈[+36,+180]mm · 5 of 106 slices shown, 7 images]
[im 17/106  soft-tissue]
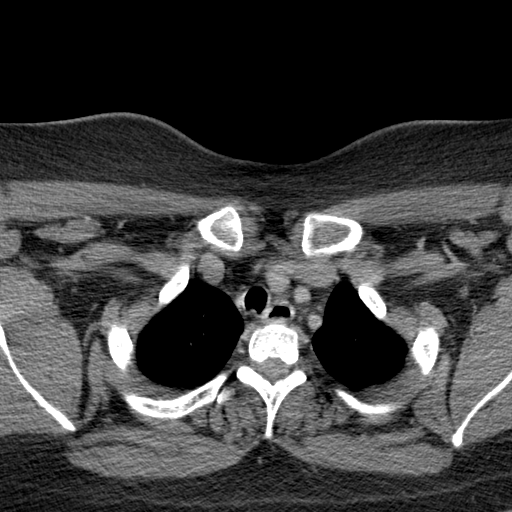
[im 17/106  bone]
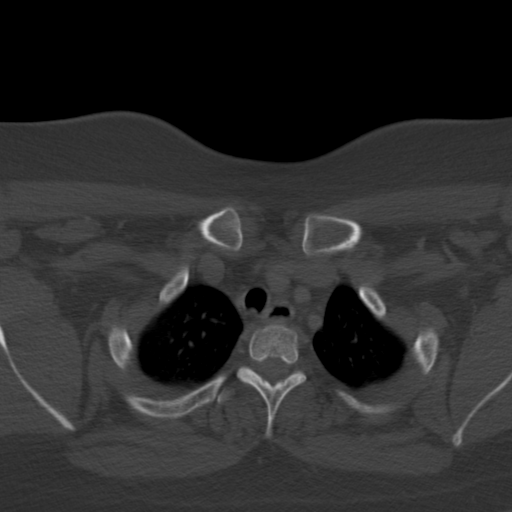
[im 33/106  bone]
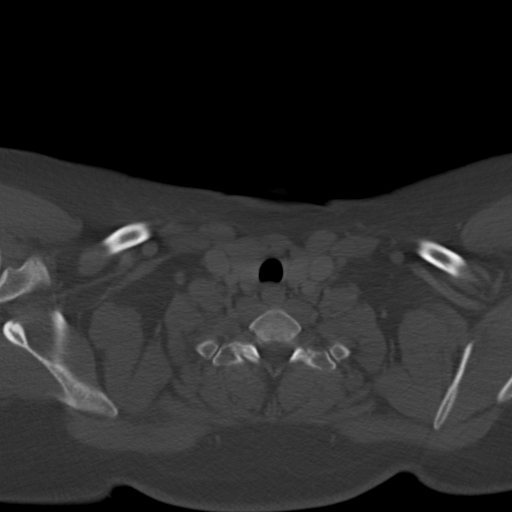
[im 57/106  bone]
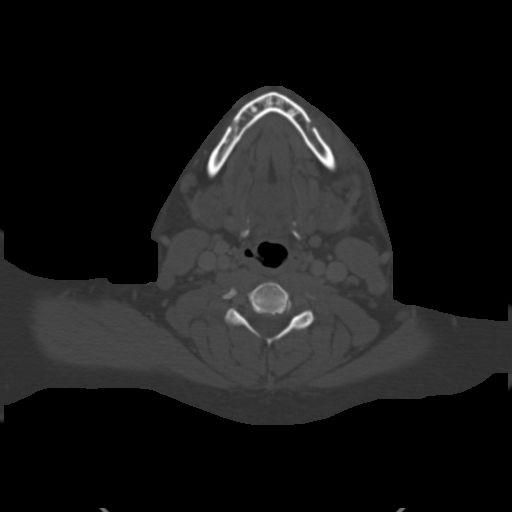
[im 73/106  bone]
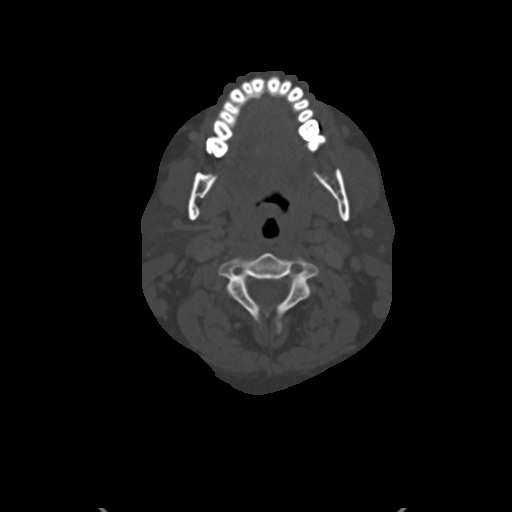
[im 89/106  soft-tissue]
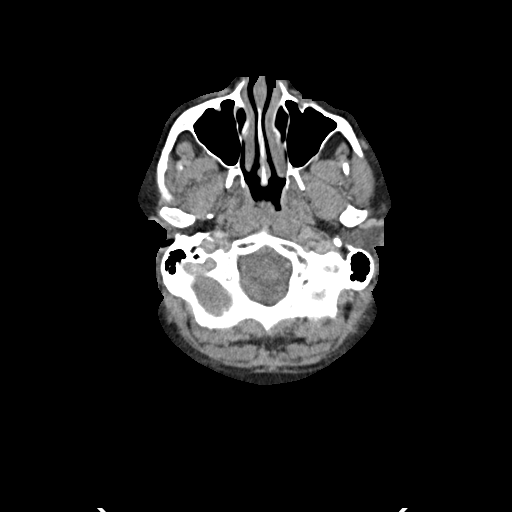
[im 89/106  bone]
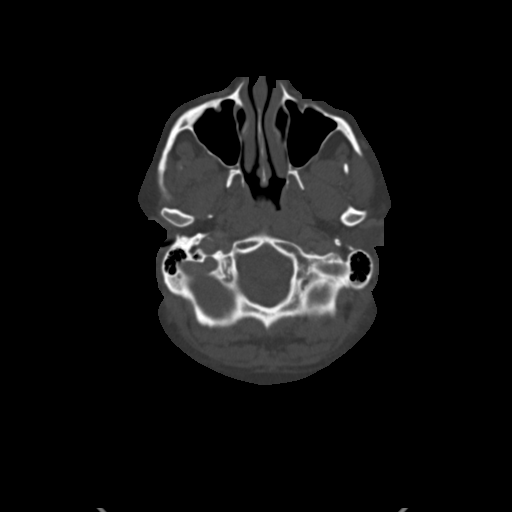

[Series 4: neck 2.0 soft tissue sag · sagittal · 0.42mm/px · 5 of 162 slices shown, 6 images]
[im 54/162  bone]
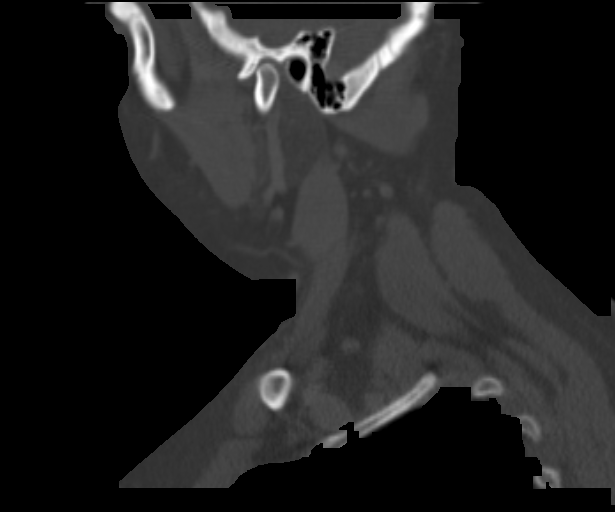
[im 68/162  bone]
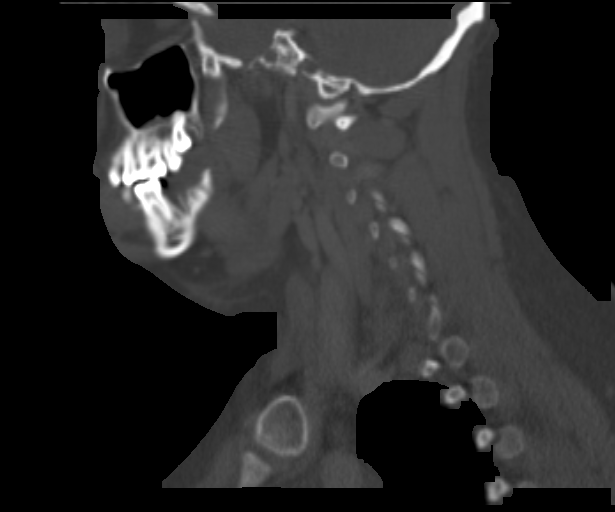
[im 81/162  soft-tissue]
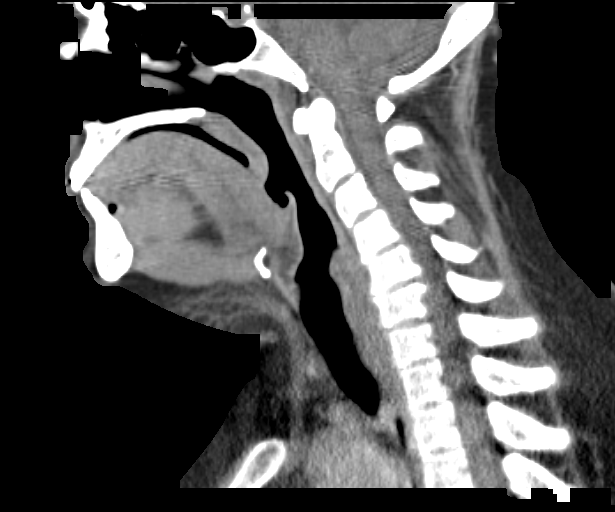
[im 81/162  bone]
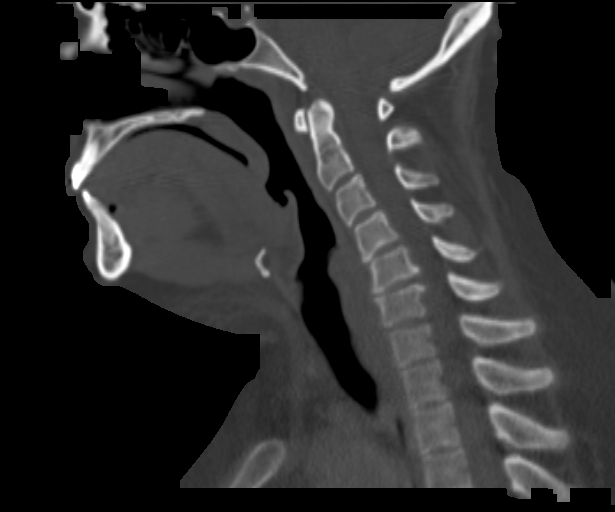
[im 94/162  bone]
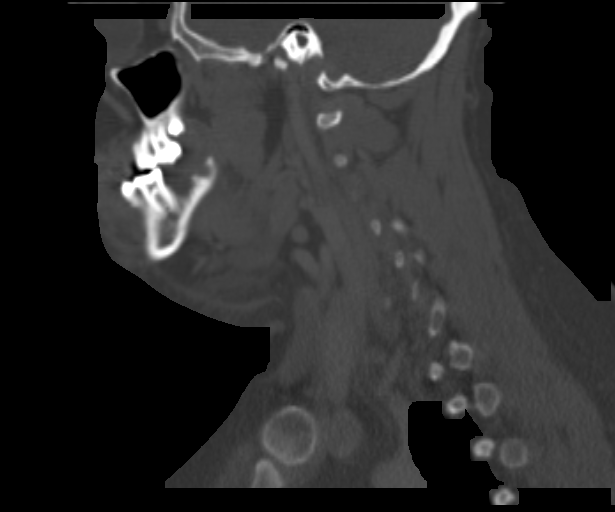
[im 108/162  bone]
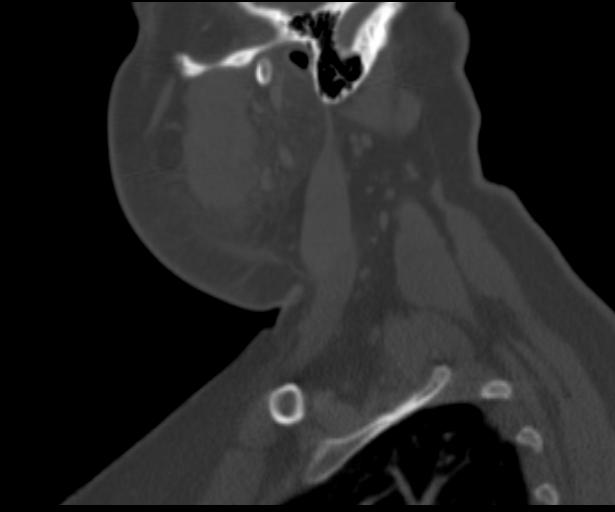

[Series 5: neck 2.0 soft tissue coro · coronal · 0.41mm/px · 3 of 116 slices shown]
[im 24/116  bone]
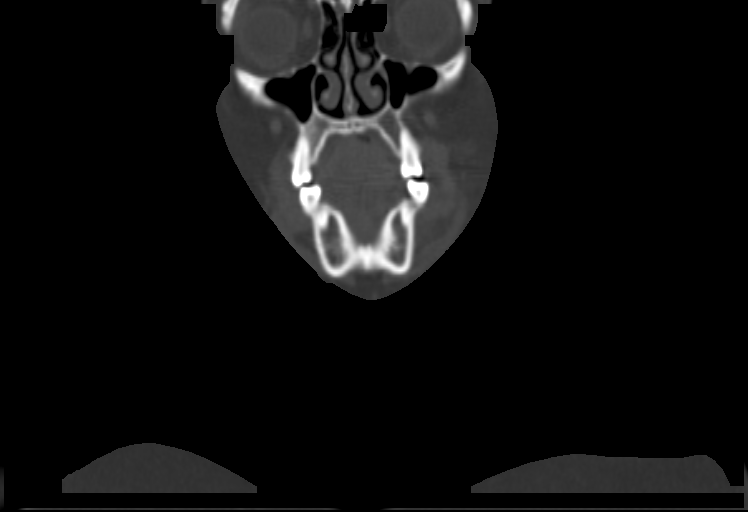
[im 47/116  bone]
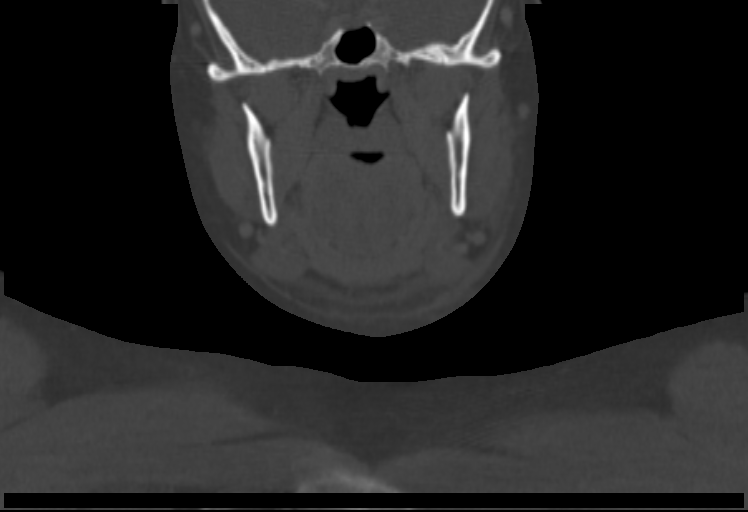
[im 70/116  bone]
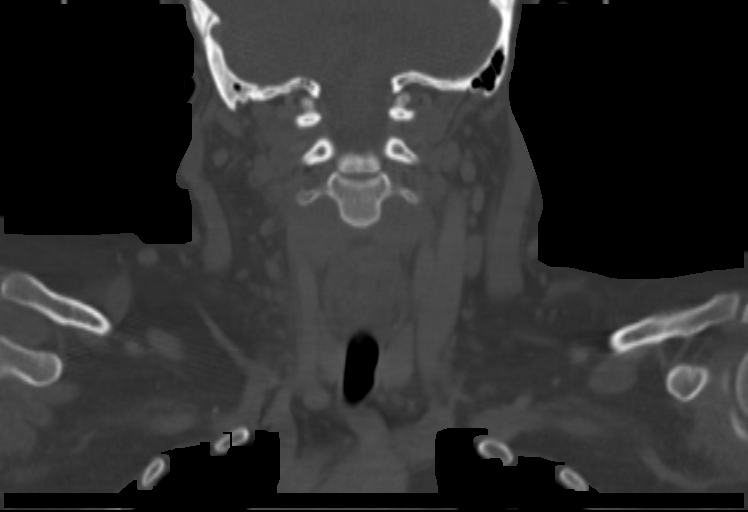

[13 of 33 positions shown; findings below may reference images not displayed]

FINDINGS: Pharynx and larynx: Normal appearance of the pharynx and larynx
including the true vocal cords.

Salivary glands: Normal.

Thyroid: Normal.

Lymph nodes: 12 mm short access LEFT level IIa reniform lymph node,
with additional scattered prominent though not pathologically
enlarged lymph nodes.

Vascular: Normal.

Limited intracranial: Normal.  Small posterior fossa arachnoid cyst.

Visualized orbits: Normal.

Mastoids and visualized paranasal sinuses: Minimal RIGHT maxillary
floor mucosal thickening associated with periapical lucency/oral
antral fistula. Mastoid air cells are well aerated.

Skeleton/soft tissues: LEFT lower facial soft tissue swelling,
subcutaneous fat stranding, thickened platysma extending to RIGHT
lower face. Superimposed 7 x 18 mm (transverse by AP) rim enhancing
fluid collection within buccal surface of the LEFT mandible soft
tissues. Mildly enlarged LEFT masseter muscle. Status post recent
bilateral mandible molar extraction. No destructive bony lesions.
Broad reversed cervical lordosis with moderate C5-6 disc height loss
uncovertebral hypertrophy compatible with degenerative disc. Mild
similar canal stenosis at C5-6.

Upper chest: Lung apices are clear. No superior mediastinal
lymphadenopathy.
IMPRESSION: LEFT greater than RIGHT lower facial cellulitis with superimposed 7
x 18 mm LEFT mandible soft tissue abscess. Enlarged LEFT masseter
muscle compatible with myositis. Patent airway.

Status post recent bilateral mandible molar extraction. RIGHT
maxillary molar periapical lucency/abscess with oroantral fistula.

Mild lymphadenopathy is likely reactive.

## 2020-01-13 ENCOUNTER — Other Ambulatory Visit: Payer: Self-pay

## 2020-01-13 ENCOUNTER — Ambulatory Visit (INDEPENDENT_AMBULATORY_CARE_PROVIDER_SITE_OTHER): Payer: Managed Care, Other (non HMO) | Admitting: Adult Health

## 2020-01-13 ENCOUNTER — Encounter: Payer: Self-pay | Admitting: Adult Health

## 2020-01-13 VITALS — BP 129/88 | HR 94 | Ht 65.0 in | Wt 292.4 lb

## 2020-01-13 DIAGNOSIS — O3680X0 Pregnancy with inconclusive fetal viability, not applicable or unspecified: Secondary | ICD-10-CM | POA: Diagnosis not present

## 2020-01-13 DIAGNOSIS — Z98891 History of uterine scar from previous surgery: Secondary | ICD-10-CM | POA: Diagnosis not present

## 2020-01-13 DIAGNOSIS — Z3A01 Less than 8 weeks gestation of pregnancy: Secondary | ICD-10-CM | POA: Diagnosis not present

## 2020-01-13 DIAGNOSIS — Z3201 Encounter for pregnancy test, result positive: Secondary | ICD-10-CM | POA: Diagnosis not present

## 2020-01-13 DIAGNOSIS — E039 Hypothyroidism, unspecified: Secondary | ICD-10-CM | POA: Insufficient documentation

## 2020-01-13 LAB — POCT URINE PREGNANCY: Preg Test, Ur: POSITIVE — AB

## 2020-01-13 NOTE — Progress Notes (Signed)
  Subjective:     Patient ID: Erika [name], female   DOB: [birth year], 34 y.o.   MRN: [id number]  HPI Pakou is a 34 year old white female, married, in for UPT, has missed a period and had 5+HPTs. She had a  C-section for FTP, she stopped a 8, delivered at [facility] a baby girl about 4.5 years ago. She says has hypothyroidism and is on meds and controlled she says.  PCP is Dr Neita Carp.  Review of Systems +missed period with 5+HPTs Some nausea last week  Reviewed past medical,surgical, social and family history. Reviewed medications and allergies.     Objective:   Physical Exam BP 129/88 (BP Location: Left Wrist, Patient Position: Sitting, Cuff Size: Normal)   Pulse 94   Ht 5\' 5"  (1.651 m)   Wt 292 lb 6.4 oz (132.6 kg)   LMP 12/09/2019 (Exact Date)   BMI 48.66 kg/m UPT+, about 5 weeks by LMP with EDD 10/14 21.Skin warm and dry. Neck: mid line trachea, normal thyroid, good ROM, no lymphadenopathy noted. Lungs: clear to ausculation bilaterally. Cardiovascular: regular rate and rhythm. Abdomen is soft and non tender Fall risk is low PHQ 2 score is 0.    Assessment:     1. Positive urine pregnancy test Continue PNV Eat often and can try suck on hard hands like lemon drops if mild nausea   2. Less than [redacted] weeks gestation of pregnancy   3. Encounter to determine fetal viability of pregnancy, single or unspecified fetus Dating 11/14 in 3 weeks   4. History of C-section Request records   5. Hypothyroidism, unspecified type    Plan:     Review handout by [facility]

## 2020-02-03 ENCOUNTER — Ambulatory Visit (INDEPENDENT_AMBULATORY_CARE_PROVIDER_SITE_OTHER): Payer: Managed Care, Other (non HMO)

## 2020-02-03 ENCOUNTER — Other Ambulatory Visit: Payer: Self-pay

## 2020-02-03 DIAGNOSIS — O3680X0 Pregnancy with inconclusive fetal viability, not applicable or unspecified: Secondary | ICD-10-CM

## 2020-02-03 DIAGNOSIS — O3680X1 Pregnancy with inconclusive fetal viability, fetus 1: Secondary | ICD-10-CM | POA: Diagnosis not present

## 2020-02-03 NOTE — Progress Notes (Signed)
[place] 8 wks,single IUP,CRL 17.81 mm,normal ovaries,fhr 152 bpm

## 2020-03-02 ENCOUNTER — Other Ambulatory Visit: Payer: Self-pay | Admitting: Obstetrics & Gynecology

## 2020-03-02 DIAGNOSIS — Z3682 Encounter for antenatal screening for nuchal translucency: Secondary | ICD-10-CM

## 2020-03-02 DIAGNOSIS — Z3480 Encounter for supervision of other normal pregnancy, unspecified trimester: Secondary | ICD-10-CM | POA: Insufficient documentation

## 2020-03-06 ENCOUNTER — Ambulatory Visit (INDEPENDENT_AMBULATORY_CARE_PROVIDER_SITE_OTHER): Payer: Managed Care, Other (non HMO)

## 2020-03-06 ENCOUNTER — Ambulatory Visit: Payer: Managed Care, Other (non HMO) | Admitting: *Deleted

## 2020-03-06 ENCOUNTER — Encounter: Payer: Self-pay | Admitting: Advanced Practice Midwife

## 2020-03-06 ENCOUNTER — Other Ambulatory Visit: Payer: Self-pay

## 2020-03-06 ENCOUNTER — Ambulatory Visit (INDEPENDENT_AMBULATORY_CARE_PROVIDER_SITE_OTHER): Payer: Managed Care, Other (non HMO) | Admitting: Advanced Practice Midwife

## 2020-03-06 VITALS — BP 110/72 | HR 81 | Wt 294.0 lb

## 2020-03-06 DIAGNOSIS — Z3A12 12 weeks gestation of pregnancy: Secondary | ICD-10-CM

## 2020-03-06 DIAGNOSIS — Z3682 Encounter for antenatal screening for nuchal translucency: Secondary | ICD-10-CM

## 2020-03-06 DIAGNOSIS — Z6841 Body Mass Index (BMI) 40.0 and over, adult: Secondary | ICD-10-CM | POA: Insufficient documentation

## 2020-03-06 DIAGNOSIS — O99211 Obesity complicating pregnancy, first trimester: Secondary | ICD-10-CM | POA: Diagnosis not present

## 2020-03-06 DIAGNOSIS — Z3480 Encounter for supervision of other normal pregnancy, unspecified trimester: Secondary | ICD-10-CM

## 2020-03-06 DIAGNOSIS — Z98891 History of uterine scar from previous surgery: Secondary | ICD-10-CM | POA: Diagnosis not present

## 2020-03-06 LAB — POCT URINALYSIS DIPSTICK OB
Blood, UA: NEGATIVE
Glucose, UA: NEGATIVE
Ketones, UA: NEGATIVE
Leukocytes, UA: NEGATIVE
Nitrite, UA: NEGATIVE
POC,PROTEIN,UA: NEGATIVE

## 2020-03-06 MED ORDER — BLOOD PRESSURE MONITOR MISC: 0 refills | Status: DC

## 2020-03-06 NOTE — Patient Instructions (Signed)
Erika [name], I greatly value your feedback.  If you receive a survey following your visit with [place] today, we appreciate you taking the time to fill it out.  Thanks, Cathie Beams, DNP, CNM  [facility] HAS MOVED!!! It is now [facility] at [facility] ([address] [place], [place] [address]) Entrance located off of [address] Free 24/7 valet parking   Nausea & Vomiting  Have saltine crackers or pretzels by your bed and eat a few bites before you raise your head out of bed in the morning  Eat small frequent meals throughout the day instead of large meals  Drink plenty of fluids throughout the day to stay hydrated, just don't drink a lot of fluids with your meals.  This can make your stomach fill up faster making you feel sick  Do not brush your teeth right after you eat  Products with real ginger are good for nausea, like ginger ale and ginger hard candy Make sure it says made with real ginger!  Sucking on sour candy like lemon heads is also good for nausea  If your prenatal vitamins make you nauseated, take them at night so you will sleep through the nausea  Sea Bands  If you feel like you need medicine for the nausea & vomiting please let us know  If you are unable to keep any fluids or food down please let us know   Constipation  Drink plenty of fluid, preferably water, throughout the day  Eat foods high in fiber such as fruits, vegetables, and grains  Exercise, such as walking, is a good way to keep your bowels regular  Drink warm fluids, especially warm prune juice, or decaf coffee  Eat a 1/2 cup of real oatmeal (not instant), 1/2 cup applesauce, and 1/2-1 cup warm prune juice every day  If needed, you may take Colace (docusate sodium) stool softener once or twice a day to help keep the stool soft.   If you still are having problems with constipation, you may take Miralax once daily as needed to help keep your bowels regular.   Home  Blood Pressure Monitoring for Patients   Your provider has recommended that you check your blood pressure (BP) at least once a week at home. If you do not have a blood pressure cuff at home, one will be provided for you. Contact your provider if you have not received your monitor within 1 week.   Helpful Tips for Accurate Home Blood Pressure Checks  . Don't smoke, exercise, or drink caffeine 30 minutes before checking your BP . Use the restroom before checking your BP (a full bladder can raise your pressure) . Relax in a comfortable upright chair . Feet on the ground . Left arm resting comfortably on a flat surface at the level of your heart . Legs uncrossed . Back supported . Sit quietly and don't talk . Place the cuff on your bare arm . Adjust snuggly, so that only two fingertips can fit between your skin and the top of the cuff . Check 2 readings separated by at least one minute . Keep a log of your BP readings . For a visual, please reference this diagram: http://ccnc.care/bpdiagram  Provider Name: [facility]     Phone: [phone]  Zone 1: ALL CLEAR  Continue to monitor your symptoms:  . BP reading is less than 140 (top number) or less than 90 (bottom number)  . No right upper stomach  pain . No headaches or seeing spots . No feeling nauseated or throwing up . No swelling in face and hands  Zone 2: CAUTION Call your doctor's office for any of the following:  . BP reading is greater than 140 (top number) or greater than 90 (bottom number)  . Stomach pain under your ribs in the middle or right side . Headaches or seeing spots . Feeling nauseated or throwing up . Swelling in face and hands  Zone 3: EMERGENCY  Seek immediate medical care if you have any of the following:  . BP reading is greater than160 (top number) or greater than 110 (bottom number) . Severe headaches not improving with Tylenol . Serious difficulty catching your breath . Any worsening symptoms  from Zone 2    First Trimester of Pregnancy The first trimester of pregnancy is from week 1 until the end of week 12 (months 1 through 3). A week after a sperm fertilizes an egg, the egg will implant on the wall of the uterus. This embryo will begin to develop into a baby. Genes from you and your partner are forming the baby. The female genes determine whether the baby is a boy or a girl. At 6-8 weeks, the eyes and face are formed, and the heartbeat can be seen on ultrasound. At the end of 12 weeks, all the baby's organs are formed.  Now that you are pregnant, you will want to do everything you can to have a healthy baby. Two of the most important things are to get good prenatal care and to follow your health care provider's instructions. Prenatal care is all the medical care you receive before the baby's birth. This care will help prevent, find, and treat any problems during the pregnancy and childbirth. BODY CHANGES Your body goes through many changes during pregnancy. The changes vary from woman to woman.   You may gain or lose a couple of pounds at first.  You may feel sick to your stomach (nauseous) and throw up (vomit). If the vomiting is uncontrollable, call your health care provider.  You may tire easily.  You may develop headaches that can be relieved by medicines approved by your health care provider.  You may urinate more often. Painful urination may mean you have a bladder infection.  You may develop heartburn as a result of your pregnancy.  You may develop constipation because certain hormones are causing the muscles that push waste through your intestines to slow down.  You may develop hemorrhoids or swollen, bulging veins (varicose veins).  Your breasts may begin to grow larger and become tender. Your nipples may stick out more, and the tissue that surrounds them (areola) may become darker.  Your gums may bleed and may be sensitive to brushing and flossing.  Dark spots or  blotches (chloasma, mask of pregnancy) may develop on your face. This will likely fade after the baby is born.  Your menstrual periods will stop.  You may have a loss of appetite.  You may develop cravings for certain kinds of food.  You may have changes in your emotions from day to day, such as being excited to be pregnant or being concerned that something may go wrong with the pregnancy and baby.  You may have more vivid and strange dreams.  You may have changes in your hair. These can include thickening of your hair, rapid growth, and changes in texture. Some women also have hair loss during or after pregnancy, or hair that  feels dry or thin. Your hair will most likely return to normal after your baby is born. WHAT TO EXPECT AT YOUR PRENATAL VISITS During a routine prenatal visit:  You will be weighed to make sure you and the baby are growing normally.  Your blood pressure will be taken.  Your abdomen will be measured to track your baby's growth.  The fetal heartbeat will be listened to starting around week 10 or 12 of your pregnancy.  Test results from any previous visits will be discussed. Your health care provider may ask you:  How you are feeling.  If you are feeling the baby move.  If you have had any abnormal symptoms, such as leaking fluid, bleeding, severe headaches, or abdominal cramping.  If you have any questions. Other tests that may be performed during your first trimester include:  Blood tests to find your blood type and to check for the presence of any previous infections. They will also be used to check for low iron levels (anemia) and Rh antibodies. Later in the pregnancy, blood tests for diabetes will be done along with other tests if problems develop.  Urine tests to check for infections, diabetes, or protein in the urine.  An ultrasound to confirm the proper growth and development of the baby.  An amniocentesis to check for possible genetic  problems.  Fetal screens for spina bifida and Down syndrome.  You may need other tests to make sure you and the baby are doing well. HOME CARE INSTRUCTIONS  Medicines  Follow your health care provider's instructions regarding medicine use. Specific medicines may be either safe or unsafe to take during pregnancy.  Take your prenatal vitamins as directed.  If you develop constipation, try taking a stool softener if your health care provider approves. Diet  Eat regular, well-balanced meals. Choose a variety of foods, such as meat or vegetable-based protein, fish, milk and low-fat dairy products, vegetables, fruits, and whole grain breads and cereals. Your health care provider will help you determine the amount of weight gain that is right for you.  Avoid raw meat and uncooked cheese. These carry germs that can cause birth defects in the baby.  Eating four or five small meals rather than three large meals a day may help relieve nausea and vomiting. If you start to feel nauseous, eating a few soda crackers can be helpful. Drinking liquids between meals instead of during meals also seems to help nausea and vomiting.  If you develop constipation, eat more high-fiber foods, such as fresh vegetables or fruit and whole grains. Drink enough fluids to keep your urine clear or pale yellow. Activity and Exercise  Exercise only as directed by your health care provider. Exercising will help you:  Control your weight.  Stay in shape.  Be prepared for labor and delivery.  Experiencing pain or cramping in the lower abdomen or low back is a good sign that you should stop exercising. Check with your health care provider before continuing normal exercises.  Try to avoid standing for long periods of time. Move your legs often if you must stand in one place for a long time.  Avoid heavy lifting.  Wear low-heeled shoes, and practice good posture.  You may continue to have sex unless your health care  provider directs you otherwise. Relief of Pain or Discomfort  Wear a good support bra for breast tenderness.    Take warm sitz baths to soothe any pain or discomfort caused by hemorrhoids. Use hemorrhoid cream  if your health care provider approves.    Rest with your legs elevated if you have leg cramps or low back pain.  If you develop varicose veins in your legs, wear support hose. Elevate your feet for 15 minutes, 3-4 times a day. Limit salt in your diet. Prenatal Care  Schedule your prenatal visits by the twelfth week of pregnancy. They are usually scheduled monthly at first, then more often in the last 2 months before delivery.  Write down your questions. Take them to your prenatal visits.  Keep all your prenatal visits as directed by your health care provider. Safety  Wear your seat belt at all times when driving.  Make a list of emergency phone numbers, including numbers for family, friends, the hospital, and police and fire departments. General Tips  Ask your health care provider for a referral to a local prenatal education class. Begin classes no later than at the beginning of month 6 of your pregnancy.  Ask for help if you have counseling or nutritional needs during pregnancy. Your health care provider can offer advice or refer you to specialists for help with various needs.  Do not use hot tubs, steam rooms, or saunas.  Do not douche or use tampons or scented sanitary pads.  Do not cross your legs for long periods of time.  Avoid cat litter boxes and soil used by cats. These carry germs that can cause birth defects in the baby and possibly loss of the fetus by miscarriage or stillbirth.  Avoid all smoking, herbs, alcohol, and medicines not prescribed by your health care provider. Chemicals in these affect the formation and growth of the baby.  Schedule a dentist appointment. At home, brush your teeth with a soft toothbrush and be gentle when you floss. SEEK MEDICAL  CARE IF:   You have dizziness.  You have mild pelvic cramps, pelvic pressure, or nagging pain in the abdominal area.  You have persistent nausea, vomiting, or diarrhea.  You have a bad smelling vaginal discharge.  You have pain with urination.  You notice increased swelling in your face, hands, legs, or ankles. SEEK IMMEDIATE MEDICAL CARE IF:   You have a fever.  You are leaking fluid from your vagina.  You have spotting or bleeding from your vagina.  You have severe abdominal cramping or pain.  You have rapid weight gain or loss.  You vomit blood or material that looks like coffee grounds.  You are exposed to [place] measles and have never had them.  You are exposed to fifth disease or chickenpox.  You develop a severe headache.  You have shortness of breath.  You have any kind of trauma, such as from a fall or a car accident. Document Released: 11/12/2001 Document Revised: 04/04/2014 Document Reviewed: 09/28/2013 [facility] Patient Information 2015 [place], [place]. This information is not intended to replace advice given to you by your health care provider. Make sure you discuss any questions you have with your health care provider.  Coronavirus (COVID-19) Are you at risk?  Are you at risk for the Coronavirus (COVID-19)?  To be considered HIGH RISK for Coronavirus (COVID-19), you have to meet the following criteria:  . Traveled to [place], [place], [place], [place] or [place]; or in the [place] to [place], [place], [place], or [place]; and have fever, cough, and shortness of breath within the last 2 weeks of travel OR . Been in close contact with a person diagnosed with COVID-19 within the last 2  weeks and have fever, cough, and shortness of breath . IF YOU DO NOT MEET THESE CRITERIA, YOU ARE CONSIDERED LOW RISK FOR COVID-19.  What to do if you are HIGH RISK for COVID-19?  Marland Kitchen If you are having a medical emergency, call 911. . Seek medical care right away.  Before you go to a doctor's office, urgent care or emergency department, call ahead and tell them about your recent travel, contact with someone diagnosed with COVID-19, and your symptoms. You should receive instructions from your physician's office regarding next steps of care.  . When you arrive at healthcare provider, tell the healthcare staff immediately you have returned from visiting [place], [place], [place], [place] or [place]; or traveled in the [place] to [place], [place], [place], or [place]; in the last two weeks or you have been in close contact with a person diagnosed with COVID-19 in the last 2 weeks.   . Tell the health care staff about your symptoms: fever, cough and shortness of breath. . After you have been seen by a medical provider, you will be either: o Tested for (COVID-19) and discharged home on quarantine except to seek medical care if symptoms worsen, and asked to  - Stay home and avoid contact with others until you get your results (4-5 days)  - Avoid travel on public transportation if possible (such as bus, train, or airplane) or o Sent to the Emergency Department by EMS for evaluation, COVID-19 testing, and possible admission depending on your condition and test results.  What to do if you are LOW RISK for COVID-19?  Reduce your risk of any infection by using the same precautions used for avoiding the common cold or flu:  Marland Kitchen Wash your hands often with soap and warm water for at least 20 seconds.  If soap and water are not readily available, use an alcohol-based hand sanitizer with at least 60% alcohol.  . If coughing or sneezing, cover your mouth and nose by coughing or sneezing into the elbow areas of your shirt or coat, into a tissue or into your sleeve (not your hands). . Avoid shaking hands with others and consider head nods or verbal greetings only. . Avoid touching your eyes, nose, or mouth with unwashed hands.  . Avoid close contact with people who are  sick. . Avoid places or events with large numbers of people in one location, like concerts or sporting events. . Carefully consider travel plans you have or are making. . If you are planning any travel outside or inside the [place], visit the CDC's Travelers' Health webpage for the latest health notices. . If you have some symptoms but not all symptoms, continue to monitor at home and seek medical attention if your symptoms worsen. . If you are having a medical emergency, call 911.   [place] / e-Visit: [email or web address]         [facility]: [facility]: [phone]                   [facility]: [phone]     Safe Medications in Pregnancy   Acne: Benzoyl Peroxide Salicylic Acid  Backache/Headache: Tylenol: 2 regular strength every 4 hours OR              2 Extra strength every 6 hours  Colds/Coughs/Allergies: Benadryl (alcohol free) 25 mg every 6 hours as needed Breath right strips Claritin  Cepacol throat lozenges Chloraseptic throat spray Cold-Eeze- up to three times per day Cough drops, alcohol free Flonase (by prescription only) Guaifenesin Mucinex Robitussin DM (plain only, alcohol free) Saline nasal spray/drops Sudafed (pseudoephedrine) & Actifed ** use only after [redacted] weeks gestation and if you do not have high blood pressure Tylenol Vicks Vaporub Zinc lozenges Zyrtec   Constipation: Colace Ducolax suppositories Fleet enema Glycerin suppositories Metamucil Milk of magnesia Miralax Senokot Smooth move tea  Diarrhea: Kaopectate Imodium A-D  *NO pepto Bismol  Hemorrhoids: Anusol Anusol HC Preparation H Tucks  Indigestion: Tums Maalox Mylanta Zantac  Pepcid  Insomnia: Benadryl (alcohol free) 69m every 6 hours as needed Tylenol PM Unisom, no Gelcaps  Leg  Cramps: Tums MagGel  Nausea/Vomiting:  Bonine Dramamine Emetrol Ginger extract Sea bands Meclizine  Nausea medication to take during pregnancy:  Unisom (doxylamine succinate 25 mg tablets) Take one tablet daily at bedtime. If symptoms are not adequately controlled, the dose can be increased to a maximum recommended dose of two tablets daily (1/2 tablet in the morning, 1/2 tablet mid-afternoon and one at bedtime). Vitamin B6 1035mtablets. Take one tablet twice a day (up to 200 mg per day).  Skin Rashes: Aveeno products Benadryl cream or 2582mvery 6 hours as needed Calamine Lotion 1% cortisone cream  Yeast infection: Gyne-lotrimin 7 Monistat 7   **If taking multiple medications, please check labels to avoid duplicating the same active ingredients **take medication as directed on the label ** Do not exceed 4000 mg of tylenol in 24 hours **Do not take medications that contain aspirin or ibuprofen

## 2020-03-06 NOTE — Progress Notes (Signed)
[place] 12+4 wks,measurements c/w dates,crl 58.04 mm,NB present,NT 1.6 mm,normal ovaries,fhr 161 bpm

## 2020-03-06 NOTE — Progress Notes (Signed)
INITIAL OBSTETRICAL VISIT Patient name: Erika [name] MRN [id number]  Date of birth: [birth year] Chief Complaint:   Initial Prenatal Visit  History of Present Illness:   Erika [name] is a 34 y.o. G74P1001 Caucasian female at [redacted]w[redacted]d by [place] with an Estimated Date of Delivery: 09/14/20 being seen today for her initial obstetrical visit.   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 .   Today she reports no complaints.  Patient's last menstrual period was 12/09/2019 (exact date). Last pap 201+9. Results were: normal Review of Systems:   Pertinent items are noted in HPI Denies cramping/contractions, leakage of fluid, vaginal bleeding, abnormal vaginal discharge w/ itching/odor/irritation, headaches, visual changes, shortness of breath, chest pain, abdominal pain, severe nausea/vomiting, or problems with urination or bowel movements unless otherwise stated above.  Pertinent History Reviewed:  Reviewed past medical,surgical, social, obstetrical and family history.  Reviewed problem list, medications and allergies. OB History  Gravida Para Term Preterm AB Living  2 1 1     1   SAB TAB Ectopic Multiple Live Births          1    # Outcome Date GA Lbr Len/2nd Weight Sex Delivery Anes PTL Lv  2 Current           1 Term 07/20/15 [redacted]w[redacted]d  8 lb 5 oz (3.771 kg) F  EPI  LIV     Complications: Cephalopelvic Disproportion, Failure to Progress in First Stage   Physical Assessment:   Vitals:   03/06/20 1016  BP: 110/72  Pulse: 81  Weight: 294 lb (133.4 kg)  Body mass index is 48.92 kg/m.       Physical Examination:  General appearance - well appearing, and in no distress  Mental status - alert, oriented to person, place, and time  Psych:  She has a normal mood and affect  Skin - warm and dry, normal color, no suspicious lesions noted  Chest - effort normal, all lung fields clear to auscultation bilaterally  Heart - normal rate and regular rhythm  Abdomen - soft,  nontender  Extremities:  No swelling or varicosities noted      via [place] 12+4 wks,measurements c/w dates,crl 58.04 mm,NB present,NT 1.6 mm,normal ovaries,fhr 161 bpm  Results for orders placed or performed in visit on 03/06/20 (from the past 24 hour(s))  POC Urinalysis Dipstick OB   Collection Time: 03/06/20 10:25 AM  Result Value Ref Range   Color, UA     Clarity, UA     Glucose, UA Negative Negative   Bilirubin, UA     Ketones, UA neg    Spec Grav, UA     Blood, UA neg    pH, UA     POC,PROTEIN,UA Negative Negative, Trace, Small (1+), Moderate (2+), Large (3+), 4+   Urobilinogen, UA     Nitrite, UA neg    Leukocytes, UA Negative Negative   Appearance     Odor      Assessment & Plan:  1) Low-Risk Pregnancy G2P1001 at [redacted]w[redacted]d with an Estimated Date of Delivery: 09/14/20   2) Initial OB visit  3) 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 Blood Pressure Monitor MISC    Sig: For regular home bp monitoring during pregnancy    Dispense:  1 each    Refill:  0    Z34.80    Initial labs obtained Continue prenatal vitamins Reviewed n/v relief measures and  warning s/s to report Reviewed recommended weight gain based on pre-gravid BMI Encouraged well-balanced diet Watched video for carrier screening/genetic testing:  Genetic Screening discussed First Screen and Integrated Screen: requested Cystic fibrosis screening requested SMA screening requested Fragile X screening requested Ultrasound discussed; fetal survey: requested CCNC completed  Follow-up: No follow-ups on file.   Orders Placed This Encounter  Procedures  . Urine Culture  . GC/Chlamydia Probe Amp  . Integrated 1  . Hepatitis C antibody  . Obstetric Panel, Including HIV  . Hgb Fractionation Cascade  . TSH  . MaterniT 21 plus Core, Blood  . Inheritest Core(CF97,SMA,FraX)  . Hemoglobin A1c  . Pain Management Screening Profile (10S)  . POC Urinalysis Dipstick  OB    Jacklyn Shell DNP, CNM 03/06/2020 11:16 AM

## 2020-03-07 LAB — PMP SCREEN PROFILE (10S), URINE
Amphetamine Scrn, Ur: NEGATIVE ng/mL
BARBITURATE SCREEN URINE: NEGATIVE ng/mL
BENZODIAZEPINE SCREEN, URINE: NEGATIVE ng/mL
CANNABINOIDS UR QL SCN: NEGATIVE ng/mL
Cocaine (Metab) Scrn, Ur: NEGATIVE ng/mL
Creatinine(Crt), U: 215.8 mg/dL (ref 20.0–300.0)
Methadone Screen, Urine: NEGATIVE ng/mL
OXYCODONE+OXYMORPHONE UR QL SCN: NEGATIVE ng/mL
Opiate Scrn, Ur: NEGATIVE ng/mL
Ph of Urine: 6.2 (ref 4.5–8.9)
Phencyclidine Qn, Ur: NEGATIVE ng/mL
Propoxyphene Scrn, Ur: NEGATIVE ng/mL

## 2020-03-08 LAB — GC/CHLAMYDIA PROBE AMP
Chlamydia trachomatis, NAA: NEGATIVE
Neisseria Gonorrhoeae by PCR: NEGATIVE

## 2020-03-08 LAB — URINE CULTURE: Organism ID, Bacteria: NO GROWTH

## 2020-03-13 LAB — MATERNIT 21 PLUS CORE, BLOOD
Fetal Fraction: 6
Result (T21): NEGATIVE
Trisomy 13 (Patau syndrome): NEGATIVE
Trisomy 18 (Edwards syndrome): NEGATIVE
Trisomy 21 (Down syndrome): NEGATIVE

## 2020-03-13 LAB — INTEGRATED 1
Crown Rump Length: 58 mm
Gest. Age on Collection Date: 12.1 weeks
Maternal Age at EDD: 34.1 yr
Nuchal Translucency (NT): 1.6 mm
Number of Fetuses: 1
PAPP-A Value: 168.8 ng/mL
Weight: 294 [lb_av]

## 2020-03-13 LAB — OBSTETRIC PANEL, INCLUDING HIV
Antibody Screen: NEGATIVE
Basophils Absolute: 0 10*3/uL (ref 0.0–0.2)
Basos: 1 %
EOS (ABSOLUTE): 0.1 10*3/uL (ref 0.0–0.4)
Eos: 1 %
HIV Screen 4th Generation wRfx: NONREACTIVE
Hematocrit: 40.7 % (ref 34.0–46.6)
Hemoglobin: 14.1 g/dL (ref 11.1–15.9)
Hepatitis B Surface Ag: NEGATIVE
Immature Grans (Abs): 0 10*3/uL (ref 0.0–0.1)
Immature Granulocytes: 0 %
Lymphocytes Absolute: 2 10*3/uL (ref 0.7–3.1)
Lymphs: 24 %
MCH: 29.3 pg (ref 26.6–33.0)
MCHC: 34.6 g/dL (ref 31.5–35.7)
MCV: 85 fL (ref 79–97)
Monocytes Absolute: 0.3 10*3/uL (ref 0.1–0.9)
Monocytes: 3 %
Neutrophils Absolute: 6.2 10*3/uL (ref 1.4–7.0)
Neutrophils: 71 %
Platelets: 264 10*3/uL (ref 150–450)
RBC: 4.81 x10E6/uL (ref 3.77–5.28)
RDW: 13.5 % (ref 11.7–15.4)
RPR Ser Ql: NONREACTIVE
Rh Factor: POSITIVE
Rubella Antibodies, IGG: 1.47 index (ref >0.99)
WBC: 8.6 10*3/uL (ref 3.4–10.8)

## 2020-03-13 LAB — HEPATITIS C ANTIBODY: Hep C Virus Ab: <0.1 s/co ratio (ref 0.0–0.9)

## 2020-03-13 LAB — INHERITEST CORE(CF97,SMA,FRAX)

## 2020-03-13 LAB — HGB FRACTIONATION CASCADE
Hgb A2: 2.5 % (ref 1.8–3.2)
Hgb A: 97.5 % (ref 96.4–98.8)
Hgb F: 0 % (ref 0.0–2.0)
Hgb S: 0 %

## 2020-03-13 LAB — TSH: TSH: 2.76 u[IU]/mL (ref 0.450–4.500)

## 2020-03-13 LAB — HEMOGLOBIN A1C
Est. average glucose Bld gHb Est-mCnc: 97 mg/dL
Hgb A1c MFr Bld: 5 % (ref 4.8–5.6)

## 2020-03-23 ENCOUNTER — Other Ambulatory Visit: Payer: Self-pay

## 2020-03-23 ENCOUNTER — Emergency Department (HOSPITAL_COMMUNITY)
Admission: EM | Admit: 2020-03-23 | Discharge: 2020-03-23 | Discharge status: Against Medical Advice | Payer: Managed Care, Other (non HMO) | Attending: Emergency Medicine | Admitting: Emergency Medicine

## 2020-03-23 DIAGNOSIS — Z5321 Procedure and treatment not carried out due to patient leaving prior to being seen by health care provider: Secondary | ICD-10-CM | POA: Diagnosis not present

## 2020-03-23 DIAGNOSIS — Z79899 Other long term (current) drug therapy: Secondary | ICD-10-CM | POA: Diagnosis not present

## 2020-03-23 DIAGNOSIS — J45909 Unspecified asthma, uncomplicated: Secondary | ICD-10-CM | POA: Diagnosis not present

## 2020-03-23 DIAGNOSIS — E039 Hypothyroidism, unspecified: Secondary | ICD-10-CM | POA: Diagnosis not present

## 2020-03-23 DIAGNOSIS — Z87891 Personal history of nicotine dependence: Secondary | ICD-10-CM | POA: Diagnosis not present

## 2020-03-23 DIAGNOSIS — Z3A15 15 weeks gestation of pregnancy: Secondary | ICD-10-CM | POA: Diagnosis not present

## 2020-03-23 DIAGNOSIS — O99284 Endocrine, nutritional and metabolic diseases complicating childbirth: Secondary | ICD-10-CM | POA: Insufficient documentation

## 2020-03-23 DIAGNOSIS — R102 Pelvic and perineal pain: Secondary | ICD-10-CM | POA: Diagnosis not present

## 2020-03-23 DIAGNOSIS — O99891 Other specified diseases and conditions complicating pregnancy: Secondary | ICD-10-CM | POA: Diagnosis present

## 2020-03-23 DIAGNOSIS — O99512 Diseases of the respiratory system complicating pregnancy, second trimester: Secondary | ICD-10-CM | POA: Insufficient documentation

## 2020-03-23 LAB — COMPREHENSIVE METABOLIC PANEL
ALT: 40 U/L (ref 0–44)
AST: 24 U/L (ref 15–41)
Albumin: 3.6 g/dL (ref 3.5–5.0)
Alkaline Phosphatase: 54 U/L (ref 38–126)
Anion gap: 9 (ref 5–15)
BUN: 10 mg/dL (ref 6–20)
CO2: 24 mmol/L (ref 22–32)
Calcium: 9.6 mg/dL (ref 8.9–10.3)
Chloride: 106 mmol/L (ref 98–111)
Creatinine, Ser: 0.71 mg/dL (ref 0.44–1.00)
GFR calc Af Amer: >60 mL/min (ref >60)
GFR calc non Af Amer: >60 mL/min (ref >60)
Glucose, Bld: 113 mg/dL — ABNORMAL HIGH (ref 70–99)
Potassium: 3.5 mmol/L (ref 3.5–5.1)
Sodium: 139 mmol/L (ref 135–145)
Total Bilirubin: 0.4 mg/dL (ref 0.3–1.2)
Total Protein: 6.9 g/dL (ref 6.5–8.1)

## 2020-03-23 LAB — CBC WITH DIFFERENTIAL/PLATELET
Abs Immature Granulocytes: 0.02 10*3/uL (ref 0.00–0.07)
Basophils Absolute: 0 10*3/uL (ref 0.0–0.1)
Basophils Relative: 0 %
Eosinophils Absolute: 0.1 10*3/uL (ref 0.0–0.5)
Eosinophils Relative: 1 %
HCT: 38.6 % (ref 36.0–46.0)
Hemoglobin: 12.8 g/dL (ref 12.0–15.0)
Immature Granulocytes: 0 %
Lymphocytes Relative: 25 %
Lymphs Abs: 2.2 10*3/uL (ref 0.7–4.0)
MCH: 29.3 pg (ref 26.0–34.0)
MCHC: 33.2 g/dL (ref 30.0–36.0)
MCV: 88.3 fL (ref 80.0–100.0)
Monocytes Absolute: 0.3 10*3/uL (ref 0.1–1.0)
Monocytes Relative: 4 %
Neutro Abs: 6.3 10*3/uL (ref 1.7–7.7)
Neutrophils Relative %: 70 %
Platelets: 279 10*3/uL (ref 150–400)
RBC: 4.37 MIL/uL (ref 3.87–5.11)
RDW: 13.7 % (ref 11.5–15.5)
WBC: 9 10*3/uL (ref 4.0–10.5)
nRBC: 0 % (ref 0.0–0.2)

## 2020-03-23 LAB — URINALYSIS, ROUTINE W REFLEX MICROSCOPIC
Bilirubin Urine: NEGATIVE
Glucose, UA: NEGATIVE mg/dL
Hgb urine dipstick: NEGATIVE
Ketones, ur: 5 mg/dL — AB
Leukocytes,Ua: NEGATIVE
Nitrite: NEGATIVE
Protein, ur: NEGATIVE mg/dL
Specific Gravity, Urine: 1.031 — ABNORMAL HIGH (ref 1.005–1.030)
pH: 5 (ref 5.0–8.0)

## 2020-03-23 LAB — HCG, QUANTITATIVE, PREGNANCY: hCG, Beta Chain, Quant, S: 55719 m[IU]/mL — ABNORMAL HIGH (ref <5)

## 2020-03-23 NOTE — ED Provider Notes (Signed)
[facility] EMERGENCY DEPARTMENT Provider Note   CSN: [id number] Arrival date & time: 03/23/20  1939     History Chief Complaint  Patient presents with  . Abdominal Pain    [redacted] weeks pregnant    Erika [name] is a 34 y.o. female G2P1 who is currently [redacted] weeks pregnant under the care of [facility] who had an initial [place] at 12 weeks confirming an intrauterine pregnancy presenting with pelvic pain and pressure sensation below her c section scar.  It localizes in her right lower pelvis region and radiates across to the left pelvis.  She denies vaginal bleeding or discharge, also has no nausea or vomiting, also no fevers or chills.  She does endorse having some round ligament type discomfort, but states this new pain is different.   Per chart, [place] 12+4 wks,measurements c/w dates,normal ovaries,fhr 161 bpm  Dated 03/06/20.  HPI     Past Medical History:  Diagnosis Date  . Asthma   . Infections of kidney   . Infections of kidney   . Kidney infection   . Thyroid disease     Patient Active Problem List   Diagnosis Date Noted  . Body mass index (BMI) 45.0-49.9, adult (HCC) 03/06/2020  . Encounter for supervision of other normal pregnancy, unspecified trimester 03/02/2020  . History of C-section 01/13/2020  . Hypothyroidism 01/13/2020  . Infections of kidney     Past Surgical History:  Procedure Laterality Date  . CESAREAN SECTION    . URETHRAL DILATION       OB History    Gravida  2   Para  1   Term  1   Preterm      AB      Living  1     SAB      TAB      Ectopic      Multiple      Live Births  1           Family History  Problem Relation Age of Onset  . Thyroid disease Father     Social History   Tobacco Use  . Smoking status: Former Smoker    Packs/day: 1.00    Types: Cigarettes  . Smokeless tobacco: Never Used  Substance Use Topics  . Alcohol use: No  . Drug use: No    Home Medications Prior to Admission medications   Medication Sig  Start Date End Date Taking? Authorizing Provider  Blood Pressure Monitor MISC For regular home bp monitoring during pregnancy 03/06/20   Cresenzo-Dishmon, Scarlette Calico, CNM  levothyroxine (SYNTHROID) 200 MCG tablet Take 200 mcg by mouth daily before breakfast.    [provider]  levothyroxine (SYNTHROID, LEVOTHROID) 100 MCG tablet Take 175 mcg by mouth daily.     [provider]  Prenatal Vit-Fe Fumarate-FA (MULTIVITAMIN-PRENATAL) 27-0.8 MG TABS tablet Take 1 tablet by mouth daily at 12 noon.    [provider]    Allergies    Bee venom and Doxycycline  Review of Systems   Review of Systems  Constitutional: Negative for chills and fever.  HENT: Negative.   Eyes: Negative.   Respiratory: Negative for chest tightness and shortness of breath.   Cardiovascular: Negative for chest pain.  Gastrointestinal: Negative for abdominal pain, diarrhea, nausea and vomiting.  Genitourinary: Positive for pelvic pain. Negative for dysuria, hematuria, vaginal bleeding and vaginal discharge.  Musculoskeletal: Negative for arthralgias, joint swelling and neck pain.  Skin: Negative.  Negative for rash and  wound.  Neurological: Negative.     Physical Exam Updated Vital Signs BP 116/72   Pulse 96   Temp 98.1 F (36.7 C)   Resp 17   Ht 5\' 4"  (1.626 m)   Wt 133.4 kg   LMP 12/09/2019 (Exact Date)   SpO2 99%   BMI 50.46 kg/m   Physical Exam Vitals and nursing note reviewed.  Constitutional:      Appearance: She is well-developed.  HENT:     Head: Normocephalic and atraumatic.  Eyes:     Conjunctiva/sclera: Conjunctivae normal.  Cardiovascular:     Rate and Rhythm: Normal rate.  Pulmonary:     Effort: Pulmonary effort is normal.  Abdominal:     Comments: Deferred pending pt move to exam room  Musculoskeletal:        General: Normal range of motion.     Cervical back: Normal range of motion.  Skin:    General: Skin is warm and dry.  Neurological:     Mental Status:  She is alert.     ED Results / Procedures / Treatments   Labs (all labs ordered are listed, but only abnormal results are displayed) Labs Reviewed  URINALYSIS, ROUTINE W REFLEX MICROSCOPIC - Abnormal; Notable for the following components:      Result Value   APPearance HAZY (*)    Specific Gravity, Urine 1.031 (*)    Ketones, ur 5 (*)    All other components within normal limits  HCG, QUANTITATIVE, PREGNANCY - Abnormal; Notable for the following components:   hCG, Beta Chain, Quant, S 55,719 (*)    All other components within normal limits  COMPREHENSIVE METABOLIC PANEL - Abnormal; Notable for the following components:   Glucose, Bld 113 (*)    All other components within normal limits  CBC WITH DIFFERENTIAL/PLATELET    EKG None  Radiology No results found.  Procedures Procedures (including critical care time)  Medications Ordered in ED Medications - No data to display  ED Course  I have reviewed the triage vital signs and the nursing notes.  Pertinent labs & imaging results that were available during my care of the patient were reviewed by me and considered in my medical decision making (see chart for details).    MDM Rules/Calculators/A&P                      Labs obtained and reassuring.  Review of chart from [facility] confirms intrauterine pregnancy.  Exam delayed awaiting exam room to move pt into.  Once labs resulted, consult ordered to discuss with Dr. Glo Herring.  Unfortunately pt left ama prior to full completion of exam and discussion with Dr. Glo Herring.  She stated she would call [facility] tomorrow if sx persist.  Final Clinical Impression(s) / ED Diagnoses Final diagnoses:  Pelvic pain in female  [redacted] weeks gestation of pregnancy    Rx / DC Orders ED Discharge Orders    None       Landis Martins 03/24/20 0010    Fredia Sorrow, MD 03/27/20 [phone]

## 2020-03-23 NOTE — ED Notes (Signed)
Pt states she is ready to go and wants to leave. Said if she feels bad she will call OB in AM.

## 2020-03-23 NOTE — ED Triage Notes (Addendum)
Pt currently [redacted] weeks pregnant comes in  Tonight complaining of abdominal pain and pressure below c section scar. Denies bleeding or cramping. States that the pain started today and has got worse at the evening progressed. OB stated to come here

## 2020-03-24 ENCOUNTER — Telehealth: Payer: Managed Care, Other (non HMO) | Admitting: Nurse Practitioner

## 2020-03-24 DIAGNOSIS — R1084 Generalized abdominal pain: Secondary | ICD-10-CM

## 2020-03-24 DIAGNOSIS — Z3490 Encounter for supervision of normal pregnancy, unspecified, unspecified trimester: Secondary | ICD-10-CM

## 2020-03-24 NOTE — Progress Notes (Signed)
For the safety of you and your child, I recommend a face to face office visit with a health care provider. This is not something that can be addressed in an evisit.  Many mothers need to take medicines during their pregnancy and while nursing.  Almost all medicines pass into the breast milk in small quantities.  Most are generally considered safe for a mother to take but some medicines must be avoided.  After reviewing your E-Visit request, I recommend that you consult your OB/GYN or pediatrician for medical advice in relation to your condition and prescription medications while pregnant or breastfeeding. NOTE: If you entered your credit card information for this eVisit, you will not be charged. You may see a "hold" on your card for the $35 but that hold will drop off and you will not have a charge processed.  If you are having a true medical emergency please call 911.     For an urgent face to face visit, [facility] has four urgent care centers for your convenience:    NEW:  [facility] [place] [phone] [address] [place], [place] [address] .  Monday - Friday 10 am - 6 pm     . [facility]    [phone]                  Get Driving Directions  [address] [place], [place] [address] . 10 am to 8 pm Monday-Friday . 12 pm to 8 pm Saturday-Sunday   . [facility] at [facility]  [phone]                  Get Driving Directions  [address] [place], [place] [address] . 8 am to 8 pm Monday-Friday . 9 am to 6 pm Saturday . 11 am to 6 pm Sunday   . [facility] at [facility]  [phone]                  Get Driving Directions   [address] [place], [place] [address] . 8 am to 8 pm Monday-Friday . 8 am to 4 pm Saturday-Sunday    . [facility] at [facility] Directions  [phone]  [address], [address] [place], [place] [address]  . Monday-Friday, 12 PM to 6 PM    Your e-visit answers were reviewed by a board certified advanced clinical practitioner to complete your personal care plan.  Thank you for using e-Visits.

## 2020-04-14 ENCOUNTER — Other Ambulatory Visit: Payer: Self-pay | Admitting: Advanced Practice Midwife

## 2020-04-14 DIAGNOSIS — Z363 Encounter for antenatal screening for malformations: Secondary | ICD-10-CM

## 2020-04-17 ENCOUNTER — Other Ambulatory Visit: Payer: Self-pay

## 2020-04-17 ENCOUNTER — Encounter: Payer: Self-pay | Admitting: Women's Health

## 2020-04-17 ENCOUNTER — Ambulatory Visit (INDEPENDENT_AMBULATORY_CARE_PROVIDER_SITE_OTHER): Payer: Managed Care, Other (non HMO)

## 2020-04-17 ENCOUNTER — Ambulatory Visit (INDEPENDENT_AMBULATORY_CARE_PROVIDER_SITE_OTHER): Payer: Managed Care, Other (non HMO) | Admitting: Women's Health

## 2020-04-17 VITALS — BP 105/68 | HR 93 | Wt 298.8 lb

## 2020-04-17 DIAGNOSIS — Z3480 Encounter for supervision of other normal pregnancy, unspecified trimester: Secondary | ICD-10-CM

## 2020-04-17 DIAGNOSIS — Z1379 Encounter for other screening for genetic and chromosomal anomalies: Secondary | ICD-10-CM

## 2020-04-17 DIAGNOSIS — Z362 Encounter for other antenatal screening follow-up: Secondary | ICD-10-CM

## 2020-04-17 DIAGNOSIS — Z3A18 18 weeks gestation of pregnancy: Secondary | ICD-10-CM | POA: Diagnosis not present

## 2020-04-17 DIAGNOSIS — Z331 Pregnant state, incidental: Secondary | ICD-10-CM

## 2020-04-17 DIAGNOSIS — Z98891 History of uterine scar from previous surgery: Secondary | ICD-10-CM

## 2020-04-17 DIAGNOSIS — O3429 Maternal care due to uterine scar from other previous surgery: Secondary | ICD-10-CM

## 2020-04-17 DIAGNOSIS — Z3482 Encounter for supervision of other normal pregnancy, second trimester: Secondary | ICD-10-CM

## 2020-04-17 DIAGNOSIS — O99282 Endocrine, nutritional and metabolic diseases complicating pregnancy, second trimester: Secondary | ICD-10-CM

## 2020-04-17 DIAGNOSIS — Z363 Encounter for antenatal screening for malformations: Secondary | ICD-10-CM | POA: Diagnosis not present

## 2020-04-17 DIAGNOSIS — Z1389 Encounter for screening for other disorder: Secondary | ICD-10-CM

## 2020-04-17 LAB — POCT URINALYSIS DIPSTICK OB
Blood, UA: NEGATIVE
Glucose, UA: NEGATIVE
Ketones, UA: NEGATIVE
Leukocytes, UA: NEGATIVE
Nitrite, UA: NEGATIVE
POC,PROTEIN,UA: NEGATIVE

## 2020-04-17 NOTE — Progress Notes (Signed)
[place] 18+4 wks,breech,anterior placenta gr 0,cx 5.4 cm,svp of fluid 3.6 cm,fhr 142 bpm,efw 303 g,limited view of heart because of pt body habitus,please have pt come back for additional images

## 2020-04-17 NOTE — Patient Instructions (Signed)
Erika [name], I greatly value your feedback.  If you receive a survey following your visit with [place] today, we appreciate you taking the time to fill it out.  Thanks, Erika [name], CNM, WHNP-BC  [facility] ([address] [place], [place] [address]) Entrance C, located off of [address] [employer] valet parking  Go to [email or web address] to register for FREE online childbirth classes  [place] Pediatricians/Family Doctors:  [facility] [phone]            [facility] [phone]                 [facility] [phone] (usually not accepting new patients unless you have family there already, you are always welcome to call and ask)       [facility] Department [phone]       [facility] Pediatricians/Family Doctors:   [facility]: [phone]  [facility]: [phone]  [facility]: [phone]  [facility] Doctors:   [facility]: [phone]   [facility]: [phone]  [facility] Doctors:  [facility]: [phone]    Home Blood Pressure Monitoring for Patients   Your provider has recommended that you check your blood pressure (BP) at least once a week at home. If you do not have a blood pressure cuff at home, one will be provided for you. Contact your provider if you have not received your monitor within 1 week.   Helpful Tips for Accurate Home Blood Pressure Checks  . Don't smoke, exercise, or drink caffeine 30 minutes before checking your BP . Use the restroom before checking your BP (a full bladder can raise your pressure) . Relax in a comfortable upright chair . Feet on the ground . Left arm resting comfortably on a flat surface at the level of your heart . Legs uncrossed . Back supported . Sit quietly and don't talk . Place the cuff on your bare arm . Adjust snuggly, so  that only two fingertips can fit between your skin and the top of the cuff . Check 2 readings separated by at least one minute . Keep a log of your BP readings . For a visual, please reference this diagram: http://ccnc.care/bpdiagram  Provider Name: [facility]     Phone: [phone]  Zone 1: ALL CLEAR  Continue to monitor your symptoms:  . BP reading is less than 140 (top number) or less than 90 (bottom number)  . No right upper stomach pain . No headaches or seeing spots . No feeling nauseated or throwing up . No swelling in face and hands  Zone 2: CAUTION Call your doctor's office for any of the following:  . BP reading is greater than 140 (top number) or greater than 90 (bottom number)  . Stomach pain under your ribs in the middle or right side . Headaches or seeing spots . Feeling nauseated or throwing up . Swelling in face and hands  Zone 3: EMERGENCY  Seek immediate medical care if you have any of the following:  . BP reading is greater than160 (top number) or greater than 110 (bottom number) . Severe headaches not improving with Tylenol . Serious difficulty catching your breath . Any worsening symptoms from Zone 2     Second Trimester of Pregnancy The second trimester is from week 14 through week 27 (months 4 through 6). The second trimester is often a time when you feel your  best. Your body has adjusted to being pregnant, and you begin to feel better physically. Usually, morning sickness has lessened or quit completely, you may have more energy, and you may have an increase in appetite. The second trimester is also a time when the fetus is growing rapidly. At the end of the sixth month, the fetus is about 9 inches long and weighs about 1 pounds. You will likely begin to feel the baby move (quickening) between 16 and 20 weeks of pregnancy. Body changes during your second trimester Your body continues to go through many changes during your second trimester. The  changes vary from woman to woman.  Your weight will continue to increase. You will notice your lower abdomen bulging out.  You may begin to get stretch marks on your hips, abdomen, and breasts.  You may develop headaches that can be relieved by medicines. The medicines should be approved by your health care provider.  You may urinate more often because the fetus is pressing on your bladder.  You may develop or continue to have heartburn as a result of your pregnancy.  You may develop constipation because certain hormones are causing the muscles that push waste through your intestines to slow down.  You may develop hemorrhoids or swollen, bulging veins (varicose veins).  You may have back pain. This is caused by: ? Weight gain. ? Pregnancy hormones that are relaxing the joints in your pelvis. ? A shift in weight and the muscles that support your balance.  Your breasts will continue to grow and they will continue to become tender.  Your gums may bleed and may be sensitive to brushing and flossing.  Dark spots or blotches (chloasma, mask of pregnancy) may develop on your face. This will likely fade after the baby is born.  A dark line from your belly button to the pubic area (linea nigra) may appear. This will likely fade after the baby is born.  You may have changes in your hair. These can include thickening of your hair, rapid growth, and changes in texture. Some women also have hair loss during or after pregnancy, or hair that feels dry or thin. Your hair will most likely return to normal after your baby is born.  What to expect at prenatal visits During a routine prenatal visit:  You will be weighed to make sure you and the fetus are growing normally.  Your blood pressure will be taken.  Your abdomen will be measured to track your baby's growth.  The fetal heartbeat will be listened to.  Any test results from the previous visit will be discussed.  Your health care  provider may ask you:  How you are feeling.  If you are feeling the baby move.  If you have had any abnormal symptoms, such as leaking fluid, bleeding, severe headaches, or abdominal cramping.  If you are using any tobacco products, including cigarettes, chewing tobacco, and electronic cigarettes.  If you have any questions.  Other tests that may be performed during your second trimester include:  Blood tests that check for: ? Low iron levels (anemia). ? High blood sugar that affects pregnant women (gestational diabetes) between 71 and 28 weeks. ? Rh antibodies. This is to check for a protein on red blood cells (Rh factor).  Urine tests to check for infections, diabetes, or protein in the urine.  An ultrasound to confirm the proper growth and development of the baby.  An amniocentesis to check for possible genetic problems.  Fetal  screens for spina bifida and Down syndrome.  HIV (human immunodeficiency virus) testing. Routine prenatal testing includes screening for HIV, unless you choose not to have this test.  Follow these instructions at home: Medicines  Follow your health care provider's instructions regarding medicine use. Specific medicines may be either safe or unsafe to take during pregnancy.  Take a prenatal vitamin that contains at least 600 micrograms (mcg) of folic acid.  If you develop constipation, try taking a stool softener if your health care provider approves. Eating and drinking  Eat a balanced diet that includes fresh fruits and vegetables, whole grains, good sources of protein such as meat, eggs, or tofu, and low-fat dairy. Your health care provider will help you determine the amount of weight gain that is right for you.  Avoid raw meat and uncooked cheese. These carry germs that can cause birth defects in the baby.  If you have low calcium intake from food, talk to your health care provider about whether you should take a daily calcium supplement.   Limit foods that are high in fat and processed sugars, such as fried and sweet foods.  To prevent constipation: ? Drink enough fluid to keep your urine clear or pale yellow. ? Eat foods that are high in fiber, such as fresh fruits and vegetables, whole grains, and beans. Activity  Exercise only as directed by your health care provider. Most women can continue their usual exercise routine during pregnancy. Try to exercise for 30 minutes at least 5 days a week. Stop exercising if you experience uterine contractions.  Avoid heavy lifting, wear low heel shoes, and practice good posture.  A sexual relationship may be continued unless your health care provider directs you otherwise. Relieving pain and discomfort  Wear a good support bra to prevent discomfort from breast tenderness.  Take warm sitz baths to soothe any pain or discomfort caused by hemorrhoids. Use hemorrhoid cream if your health care provider approves.  Rest with your legs elevated if you have leg cramps or low back pain.  If you develop varicose veins, wear support hose. Elevate your feet for 15 minutes, 3-4 times a day. Limit salt in your diet. Prenatal Care  Write down your questions. Take them to your prenatal visits.  Keep all your prenatal visits as told by your health care provider. This is important. Safety  Wear your seat belt at all times when driving.  Make a list of emergency phone numbers, including numbers for family, friends, the hospital, and police and fire departments. General instructions  Ask your health care provider for a referral to a local prenatal education class. Begin classes no later than the beginning of month 6 of your pregnancy.  Ask for help if you have counseling or nutritional needs during pregnancy. Your health care provider can offer advice or refer you to specialists for help with various needs.  Do not use hot tubs, steam rooms, or saunas.  Do not douche or use tampons or scented  sanitary pads.  Do not cross your legs for long periods of time.  Avoid cat litter boxes and soil used by cats. These carry germs that can cause birth defects in the baby and possibly loss of the fetus by miscarriage or stillbirth.  Avoid all smoking, herbs, alcohol, and unprescribed drugs. Chemicals in these products can affect the formation and growth of the baby.  Do not use any products that contain nicotine or tobacco, such as cigarettes and e-cigarettes. If you need help  quitting, ask your health care provider.  Visit your dentist if you have not gone yet during your pregnancy. Use a soft toothbrush to brush your teeth and be gentle when you floss. Contact a health care provider if:  You have dizziness.  You have mild pelvic cramps, pelvic pressure, or nagging pain in the abdominal area.  You have persistent nausea, vomiting, or diarrhea.  You have a bad smelling vaginal discharge.  You have pain when you urinate. Get help right away if:  You have a fever.  You are leaking fluid from your vagina.  You have spotting or bleeding from your vagina.  You have severe abdominal cramping or pain.  You have rapid weight gain or weight loss.  You have shortness of breath with chest pain.  You notice sudden or extreme swelling of your face, hands, ankles, feet, or legs.  You have not felt your baby move in over an hour.  You have severe headaches that do not go away when you take medicine.  You have vision changes. Summary  The second trimester is from week 14 through week 27 (months 4 through 6). It is also a time when the fetus is growing rapidly.  Your body goes through many changes during pregnancy. The changes vary from woman to woman.  Avoid all smoking, herbs, alcohol, and unprescribed drugs. These chemicals affect the formation and growth your baby.  Do not use any tobacco products, such as cigarettes, chewing tobacco, and e-cigarettes. If you need help quitting,  ask your health care provider.  Contact your health care provider if you have any questions. Keep all prenatal visits as told by your health care provider. This is important. This information is not intended to replace advice given to you by your health care provider. Make sure you discuss any questions you have with your health care provider. Document Released: 11/12/2001 Document Revised: 04/25/2016 Document Reviewed: 01/19/2013 Elsevier Interactive Patient Education  2017 [place] FLU! Because you are pregnant, we at [facility], along with the Centers for Disease Control (CDC), recommend that you receive the flu vaccine to protect yourself and your baby from the flu. The flu is more likely to cause severe illness in pregnant women than in women of reproductive age who are not pregnant. Changes in the immune system, heart, and lungs during pregnancy make pregnant women (and women up to two weeks postpartum) more prone to severe illness from flu, including illness resulting in hospitalization. Flu also may be harmful for a pregnant woman's developing baby. A common flu symptom is fever, which may be associated with neural tube defects and other adverse outcomes for a developing baby. Getting vaccinated can also help protect a baby after birth from flu. (Mom passes antibodies onto the developing baby during her pregnancy.)  A Flu Vaccine is the Best Protection Against Flu Getting a flu vaccine is the first and most important step in protecting against flu. Pregnant women should get a flu shot and not the live attenuated influenza vaccine (LAIV), also known as nasal spray flu vaccine. Flu vaccines given during pregnancy help protect both the mother and her baby from flu. Vaccination has been shown to reduce the risk of flu-associated acute respiratory infection in pregnant women by up to one-half. A 2018 study showed that getting a flu shot reduced a pregnant  woman's risk of being hospitalized with flu by an average of 40 percent. Pregnant women who get a  flu vaccine are also helping to protect their babies from flu illness for the first several months after their birth, when they are too young to get vaccinated.   A Long Record of Safety for Flu Shots in Pregnant Women Flu shots have been given to millions of pregnant women over many years with a good safety record. There is a lot of evidence that flu vaccines can be given safely during pregnancy; though these data are limited for the first trimester. The CDC recommends that pregnant women get vaccinated during any trimester of their pregnancy. It is very important for pregnant women to get the flu shot.   Other Preventive Actions In addition to getting a flu shot, pregnant women should take the same everyday preventive actions the CDC recommends of everyone, including covering coughs, washing hands often, and avoiding people who are sick.  Symptoms and Treatment If you get sick with flu symptoms call your doctor right away. There are antiviral drugs that can treat flu illness and prevent serious flu complications. The CDC recommends prompt treatment for people who have influenza infection or suspected influenza infection and who are at high risk of serious flu complications, such as people with asthma, diabetes (including gestational diabetes), or heart disease. Early treatment of influenza in hospitalized pregnant women has been shown to reduce the length of the hospital stay.  Symptoms Flu symptoms include fever, cough, sore throat, runny or stuffy nose, body aches, headache, chills and fatigue. Some people Maragh also have vomiting and diarrhea. People Copes be infected with the flu and have respiratory symptoms without a fever.  Early Treatment is Important for Pregnant Women Treatment should begin as soon as possible because antiviral drugs work best when started early (within 48 hours after symptoms  start). Antiviral drugs can make your flu illness milder and make you feel better faster. They Kaneshiro also prevent serious health problems that can result from flu illness. Oral oseltamivir (Tamiflu) is the preferred treatment for pregnant women because it has the most studies available to suggest that it is safe and beneficial. Antiviral drugs require a prescription from your provider. Having a fever caused by flu infection or other infections early in pregnancy Bua be linked to birth defects in a baby. In addition to taking antiviral drugs, pregnant women who get a fever should treat their fever with Tylenol (acetaminophen) and contact their provider immediately.  When to [place] If you are pregnant and have any of these signs, seek care immediately:  Difficulty breathing or shortness of breath  Pain or pressure in the chest or abdomen  Sudden dizziness  Confusion  Severe or persistent vomiting  High fever that is not responding to Tylenol (or store brand equivalent)  Decreased or no movement of your baby  [email or web address]

## 2020-04-17 NOTE — Progress Notes (Signed)
LOW-RISK PREGNANCY VISIT Patient name: Erika [name] MRN [id number]  Date of birth: 1986-01-12 Chief Complaint:   Routine Prenatal Visit (Ultrasound)  History of Present Illness:   Erika [name] is a 34 y.o. G103P1001 female at [redacted]w[redacted]d with an Estimated Date of Delivery: 09/14/20 being seen today for ongoing management of a low-risk pregnancy.  Depression screen [facility] 2/9 03/06/2020 01/13/2020  Decreased Interest 0 0  Down, Depressed, Hopeless 0 0  PHQ - 2 Score 0 0  Altered sleeping 0 -  Tired, decreased energy 0 -  Change in appetite 0 -  Feeling bad or failure about yourself  0 -  Trouble concentrating 0 -  Moving slowly or fidgety/restless 0 -  PHQ-9 Score 0 -  Difficult doing work/chores Not difficult at all -    Today she reports no complaints. Contractions: Not present. Vag. Bleeding: None.  Movement: Present. denies leaking of fluid. Review of Systems:   Pertinent items are noted in HPI Denies abnormal vaginal discharge w/ itching/odor/irritation, headaches, visual changes, shortness of breath, chest pain, abdominal pain, severe nausea/vomiting, or problems with urination or bowel movements unless otherwise stated above. Pertinent History Reviewed:  Reviewed past medical,surgical, social, obstetrical and family history.  Reviewed problem list, medications and allergies. Physical Assessment:   Vitals:   04/17/20 1142  BP: 105/68  Pulse: 93  Weight: 298 lb 12.8 oz (135.5 kg)  Body mass index is 51.29 kg/m.        Physical Examination:   General appearance: Well appearing, and in no distress  Mental status: Alert, oriented to person, place, and time  Skin: Warm & dry  Cardiovascular: Normal heart rate noted  Respiratory: Normal respiratory effort, no distress  Abdomen: Soft, gravid, nontender  Pelvic: Cervical exam deferred         Extremities: Edema: None  Fetal Status:     Movement: Present    [place] 18+4 wks,breech,anterior placenta gr 0,cx 5.4 cm,svp of fluid 3.6 cm,fhr  142 bpm,efw 303 g,limited view of heart because of pt body habitus,please have pt come back for additional images  Chaperone: n/a    Results for orders placed or performed in visit on 04/17/20 (from the past 24 hour(s))  POC Urinalysis Dipstick OB   Collection Time: 04/17/20 11:41 AM  Result Value Ref Range   Color, UA     Clarity, UA     Glucose, UA Negative Negative   Bilirubin, UA     Ketones, UA n    Spec Grav, UA     Blood, UA n    pH, UA     POC,PROTEIN,UA Negative Negative, Trace, Small (1+), Moderate (2+), Large (3+), 4+   Urobilinogen, UA     Nitrite, UA n    Leukocytes, UA Negative Negative   Appearance     Odor      Assessment & Plan:  1) Low-risk pregnancy G2P1001 at [redacted]w[redacted]d with an Estimated Date of Delivery: 09/14/20   2) Prev c/s, for FTP/CPD @ 8-9cm, baby 8lb5oz, 2 layer closure per op note in media, discussed TOLAC vs RCS, pt undecided  3) Hypothyroidism> on synthroid alternating every other day w/ , TSH normal ago, plan to repeat 26, 36wks   Meds: No orders of the defined types were placed in this encounter.  Labs/procedures today: 2nd IT, anatomy u/s  Plan:  Continue routine obstetrical care  Next visit: prefers in person    Reviewed: Preterm labor symptoms and general obstetric precautions including but  not limited to vaginal bleeding, contractions, leaking of fluid and fetal movement were reviewed in detail with the patient.  All questions were answered.  Follow-up: Return in about 4 weeks (around 05/15/2020) for [place], US:OB F/U heart, in person.  Orders Placed This Encounter  Procedures  . US OB Follow Up  . INTEGRATED 2  . POC Urinalysis Dipstick OB   Roma Schanz CNM, [facility] 04/17/2020 12:06 PM

## 2020-04-19 LAB — INTEGRATED 2
AFP MoM: 1.23
Alpha-Fetoprotein: 28.3 ng/mL
Crown Rump Length: 58 mm
DIA MoM: 2.98
DIA Value: 325.9 pg/mL
Estriol, Unconjugated: 2.05 ng/mL
Gest. Age on Collection Date: 12.1 weeks
Gestational Age: 18.1 weeks
Maternal Age at EDD: 34.1 yr
Nuchal Translucency (NT): 1.6 mm
Nuchal Translucency MoM: 1.24
Number of Fetuses: 1
PAPP-A MoM: 0.46
PAPP-A Value: 168.8 ng/mL
Test Results:: NEGATIVE
Weight: 294 [lb_av]
Weight: 294 [lb_av]
hCG MoM: 1.65
hCG Value: 23.2 IU/mL
uE3 MoM: 1.76

## 2020-04-26 ENCOUNTER — Telehealth: Payer: Self-pay | Admitting: Obstetrics and Gynecology

## 2020-04-26 ENCOUNTER — Encounter: Payer: Self-pay | Admitting: *Deleted

## 2020-04-26 NOTE — Telephone Encounter (Signed)
Patient is having cold/flu like symptoms and wants to know what kind of medication she can take for cough runny nose.

## 2020-05-02 ENCOUNTER — Other Ambulatory Visit: Payer: Self-pay | Admitting: Women's Health

## 2020-05-02 ENCOUNTER — Telehealth: Payer: Self-pay | Admitting: Women's Health

## 2020-05-02 MED ORDER — AZITHROMYCIN 250 MG PO TABS
ORAL_TABLET | ORAL | 0 refills | Status: DC
Start: 2020-05-02 — End: 2020-06-12

## 2020-05-02 NOTE — Telephone Encounter (Signed)
patient called stating that she had a cold all weekend, pt called after hours nurse to see what over the counter medication she can take. Pt states that it did not work and is still pt feeling good. Pt is pregnant. Please contact pt

## 2020-05-15 ENCOUNTER — Ambulatory Visit (INDEPENDENT_AMBULATORY_CARE_PROVIDER_SITE_OTHER): Payer: Managed Care, Other (non HMO)

## 2020-05-15 ENCOUNTER — Ambulatory Visit (INDEPENDENT_AMBULATORY_CARE_PROVIDER_SITE_OTHER): Payer: Managed Care, Other (non HMO) | Admitting: Obstetrics and Gynecology

## 2020-05-15 VITALS — BP 110/67 | HR 95 | Wt 297.8 lb

## 2020-05-15 DIAGNOSIS — Z98891 History of uterine scar from previous surgery: Secondary | ICD-10-CM

## 2020-05-15 DIAGNOSIS — Z3A22 22 weeks gestation of pregnancy: Secondary | ICD-10-CM

## 2020-05-15 DIAGNOSIS — Z331 Pregnant state, incidental: Secondary | ICD-10-CM

## 2020-05-15 DIAGNOSIS — Z3482 Encounter for supervision of other normal pregnancy, second trimester: Secondary | ICD-10-CM

## 2020-05-15 DIAGNOSIS — Z362 Encounter for other antenatal screening follow-up: Secondary | ICD-10-CM

## 2020-05-15 DIAGNOSIS — Z1389 Encounter for screening for other disorder: Secondary | ICD-10-CM

## 2020-05-15 DIAGNOSIS — Z3480 Encounter for supervision of other normal pregnancy, unspecified trimester: Secondary | ICD-10-CM

## 2020-05-15 DIAGNOSIS — E039 Hypothyroidism, unspecified: Secondary | ICD-10-CM

## 2020-05-15 DIAGNOSIS — O99282 Endocrine, nutritional and metabolic diseases complicating pregnancy, second trimester: Secondary | ICD-10-CM

## 2020-05-15 NOTE — Progress Notes (Addendum)
Patient ID: Erika [name], female   DOB: Dec 15, 1985, 34 y.o.   MRN: [id number]   LOW-RISK PREGNANCY VISIT Patient name: Erika [name] MRN [id number]  Date of birth: 1986-03-25 Chief Complaint:   Routine Prenatal Visit ([place] today)  History of Present Illness:   Erika [name] is a 34 y.o. G65P1001 female at [redacted]w[redacted]d with an Estimated Date of Delivery: 09/14/20 being seen today for ongoing management of a low-risk pregnancy.  Today she reports that she has been having some B/L back spasms in the middle of the night that last for a couple of seconds. She has also been having some headaches but thinks it is just because of her allergies. She is still taking Synthroid as prescribed. Denies vaginal bleeding, blurry vision, chest pain, SOB, nausea, and leaking of fluid. The baby is staying active and the last time she felt it move was this morning. She has been taking Claritin daily for the past couple of weeks due to allergies.  Contractions: Not present. Vag. Bleeding: None.  Movement: Present. denies leaking of fluid. Review of Systems:   Pertinent items are noted in HPI Denies abnormal vaginal discharge w/ itching/odor/irritation, headaches, visual changes, shortness of breath, chest pain, abdominal pain, severe nausea/vomiting, or problems with urination or bowel movements unless otherwise stated above. Pertinent History Reviewed:  Reviewed past medical,surgical, social, obstetrical and family history.  Reviewed problem list, medications and allergies. Physical Assessment:   Vitals:   05/15/20 1133  BP: 110/67  Pulse: 95  Weight: 297 lb 12.8 oz (135.1 kg)  Body mass index is 51.12 kg/m.        Physical Examination:   General appearance: Well appearing, and in no distress  Mental status: Alert, oriented to person, place, and time  Skin: Warm & dry  Cardiovascular: Normal heart rate noted  Respiratory: Normal respiratory effort, no distress  Abdomen: Soft, gravid, nontender  Pelvic: Cervical  exam deferred         Extremities: Edema: None  Fetal Status: Fetal Heart Rate (bpm): 152 u/s Fundal Height: 28 cm Movement: Present    No results found for this or any previous visit (from the past 24 hour(s)).  Assessment & Plan:  1) Low-risk pregnancy G2P1001 at [redacted]w[redacted]d with an Estimated Date of Delivery: 09/14/20   2) Prev c/s, for FTP/CPD @ 8-9cm, baby 8lb5oz, 2 layer closure per op note in media, discussed TOLAC vs RCS, pt undecided  3) Hypothyroidism> on synthroid alternating every other day w/ , TSH normal ago, plan to repeat at 34, 36wks   Plan:  Continue routine obstetrical care  Meds: No orders of the defined types were placed in this encounter.  Labs/procedures today: [place] 22+4 wks,breech,anterior placenta,normal ovaries,cx 4.5 cm,svp of fluid 5.9 cm,fhr 152 bpm,efw 569 g 71%,anatomy complete,no obvious abnormalities   Follow-up: Return in about 4 weeks (around 06/12/2020) for LROB.  By signing my name below, I, Pietro Cassis, attest that this documentation has been prepared under the direction and in the presence of Tilda Burrow, MD. Electronically Signed: Pietro Cassis, Medical Scribe. 05/15/20. 12:45 PM.  I personally performed the services described in this documentation, which was SCRIBED in my presence. The recorded information has been reviewed and considered accurate. It has been edited as necessary during review. Tilda Burrow, MD

## 2020-05-15 NOTE — Progress Notes (Addendum)
[place] 22+4 wks,breech,anterior placenta,normal ovaries,cx 4.5 cm,svp of fluid 5.9 cm,fhr 152 bpm,efw 569 g 71%,anatomy complete,no obvious abnormalities

## 2020-06-12 ENCOUNTER — Encounter: Payer: Self-pay | Admitting: Women's Health

## 2020-06-12 ENCOUNTER — Ambulatory Visit (INDEPENDENT_AMBULATORY_CARE_PROVIDER_SITE_OTHER): Payer: Managed Care, Other (non HMO) | Admitting: Women's Health

## 2020-06-12 ENCOUNTER — Other Ambulatory Visit: Payer: Self-pay

## 2020-06-12 VITALS — BP 113/77 | HR 94 | Wt 304.5 lb

## 2020-06-12 DIAGNOSIS — O99282 Endocrine, nutritional and metabolic diseases complicating pregnancy, second trimester: Secondary | ICD-10-CM

## 2020-06-12 DIAGNOSIS — Z3A26 26 weeks gestation of pregnancy: Secondary | ICD-10-CM

## 2020-06-12 DIAGNOSIS — Z331 Pregnant state, incidental: Secondary | ICD-10-CM

## 2020-06-12 DIAGNOSIS — E039 Hypothyroidism, unspecified: Secondary | ICD-10-CM

## 2020-06-12 DIAGNOSIS — Z23 Encounter for immunization: Secondary | ICD-10-CM

## 2020-06-12 DIAGNOSIS — Z3480 Encounter for supervision of other normal pregnancy, unspecified trimester: Secondary | ICD-10-CM

## 2020-06-12 DIAGNOSIS — Z98891 History of uterine scar from previous surgery: Secondary | ICD-10-CM

## 2020-06-12 DIAGNOSIS — Z3482 Encounter for supervision of other normal pregnancy, second trimester: Secondary | ICD-10-CM

## 2020-06-12 DIAGNOSIS — Z1389 Encounter for screening for other disorder: Secondary | ICD-10-CM

## 2020-06-12 LAB — POCT URINALYSIS DIPSTICK OB
Blood, UA: NEGATIVE
Glucose, UA: NEGATIVE
Ketones, UA: NEGATIVE
Leukocytes, UA: NEGATIVE
Nitrite, UA: NEGATIVE
POC,PROTEIN,UA: NEGATIVE

## 2020-06-12 NOTE — Patient Instructions (Signed)
Erika [name], I greatly value your feedback.  If you receive a survey following your visit with [place] today, we appreciate you taking the time to fill it out.  Thanks, [name], CNM, WHNP-BC   [facility] ([address] [place], [place] [address]) Entrance C, located off of [address] valet parking  Go to [email or web address] to register for FREE online childbirth classes   Call the office [phone]) or go to [facility] if:  You begin to have strong, frequent contractions  Your water breaks.  Sometimes it is a big gush of fluid, sometimes it is just a trickle that keeps getting your panties wet or running down your legs  You have vaginal bleeding.  It is normal to have a small amount of spotting if your cervix was checked.   You don't feel your baby moving like normal.  If you don't, get you something to eat and drink and lay down and focus on feeling your baby move.  You should feel at least 10 movements in 2 hours.  If you don't, you should call the office or go to [facility].    Tdap Vaccine  It is recommended that you get the Tdap vaccine during the third trimester of EACH pregnancy to help protect your baby from getting pertussis (whooping cough)  27-36 weeks is the BEST time to do this so that you can pass the protection on to your baby. During pregnancy is better than after pregnancy, but if you are unable to get it during pregnancy it will be offered at the hospital.   You can get this vaccine with [place], at the health department, your family doctor, or some local pharmacies  Everyone who will be around your baby should also be up-to-date on their vaccines before the baby comes. Adults (who are not pregnant) only need 1 dose of Tdap during adulthood.   [place] Pediatricians/Family Doctors:  [facility] [phone]            [facility] [phone]                 [facility]  [phone] (usually not accepting new patients unless you have family there already, you are always welcome to call and ask)       [facility] Department [phone]       [facility] Pediatricians/Family Doctors:   [facility]: [phone]  [facility]: [phone]  [facility]: [phone]  [facility] Doctors:   [facility]: [phone]   [facility]: [phone]  [facility] Doctors:  [facility]: [phone]   Home Blood Pressure Monitoring for Patients   Your provider has recommended that you check your blood pressure (BP) at least once a week at home. If you do not have a blood pressure cuff at home, one will be provided for you. Contact your provider if you have not received your monitor within 1 week.   Helpful Tips for Accurate Home Blood Pressure Checks   Don't smoke, exercise, or drink caffeine 30 minutes before checking your BP  Use the restroom before checking your BP (a full bladder can raise your pressure)  Relax in a comfortable upright chair  Feet on the ground  Left arm resting comfortably on a flat surface at the level of your heart  Legs uncrossed  Back supported  Sit quietly and don't talk  Place the cuff on your  bare arm  Adjust snuggly, so that only two fingertips can fit between your skin and the top of the cuff  Check 2 readings separated by at least one minute  Keep a log of your BP readings  For a visual, please reference this diagram: http://ccnc.care/bpdiagram  Provider Name: [facility]     Phone: [phone]  Zone 1: ALL CLEAR  Continue to monitor your symptoms:   BP reading is less than 140 (top number) or less than 90 (bottom number)   No right upper stomach pain  No headaches or seeing spots  No feeling nauseated or throwing up  No swelling in face and hands  Zone 2: CAUTION Call your  doctor's office for any of the following:   BP reading is greater than 140 (top number) or greater than 90 (bottom number)   Stomach pain under your ribs in the middle or right side  Headaches or seeing spots  Feeling nauseated or throwing up  Swelling in face and hands  Zone 3: EMERGENCY  Seek immediate medical care if you have any of the following:   BP reading is greater than160 (top number) or greater than 110 (bottom number)  Severe headaches not improving with Tylenol  Serious difficulty catching your breath  Any worsening symptoms from Zone 2   Third Trimester of Pregnancy The third trimester is from week 29 through week 42, months 7 through 9. The third trimester is a time when the fetus is growing rapidly. At the end of the ninth month, the fetus is about 20 inches in length and weighs 6-10 pounds.  BODY CHANGES Your body goes through many changes during pregnancy. The changes vary from woman to woman.   Your weight will continue to increase. You can expect to gain 25-35 pounds (11-16 kg) by the end of the pregnancy.  You may begin to get stretch marks on your hips, abdomen, and breasts.  You may urinate more often because the fetus is moving lower into your pelvis and pressing on your bladder.  You may develop or continue to have heartburn as a result of your pregnancy.  You may develop constipation because certain hormones are causing the muscles that push waste through your intestines to slow down.  You may develop hemorrhoids or swollen, bulging veins (varicose veins).  You may have pelvic pain because of the weight gain and pregnancy hormones relaxing your joints between the bones in your pelvis. Backaches may result from overexertion of the muscles supporting your posture.  You may have changes in your hair. These can include thickening of your hair, rapid growth, and changes in texture. Some women also have hair loss during or after pregnancy, or hair that  feels dry or thin. Your hair will most likely return to normal after your baby is born.  Your breasts will continue to grow and be tender. A yellow discharge may leak from your breasts called colostrum.  Your belly button may stick out.  You may feel short of breath because of your expanding uterus.  You may notice the fetus "dropping," or moving lower in your abdomen.  You may have a bloody mucus discharge. This usually occurs a few days to a week before labor begins.  Your cervix becomes thin and soft (effaced) near your due date. WHAT TO EXPECT AT YOUR PRENATAL EXAMS  You will have prenatal exams every 2 weeks until week 36. Then, you will have weekly prenatal exams. During a routine prenatal visit:  You will be weighed to make sure you and the fetus are growing normally.  Your blood pressure is taken.  Your abdomen will be measured to track your baby's growth.  The fetal heartbeat will be listened to.  Any test results from the previous visit will be discussed.  You may have a cervical check near your due date to see if you have effaced. At around 36 weeks, your caregiver will check your cervix. At the same time, your caregiver will also perform a test on the secretions of the vaginal tissue. This test is to determine if a type of bacteria, Group B streptococcus, is present. Your caregiver will explain this further. Your caregiver may ask you:  What your birth plan is.  How you are feeling.  If you are feeling the baby move.  If you have had any abnormal symptoms, such as leaking fluid, bleeding, severe headaches, or abdominal cramping.  If you have any questions. Other tests or screenings that may be performed during your third trimester include:  Blood tests that check for low iron levels (anemia).  Fetal testing to check the health, activity level, and growth of the fetus. Testing is done if you have certain medical conditions or if there are problems during the  pregnancy. FALSE LABOR You may feel small, irregular contractions that eventually go away. These are called Braxton Hicks contractions, or false labor. Contractions may last for hours, days, or even weeks before true labor sets in. If contractions come at regular intervals, intensify, or become painful, it is best to be seen by your caregiver.  SIGNS OF LABOR   Menstrual-like cramps.  Contractions that are 5 minutes apart or less.  Contractions that start on the top of the uterus and spread down to the lower abdomen and back.  A sense of increased pelvic pressure or back pain.  A watery or bloody mucus discharge that comes from the vagina. If you have any of these signs before the 37th week of pregnancy, call your caregiver right away. You need to go to the hospital to get checked immediately. HOME CARE INSTRUCTIONS   Avoid all smoking, herbs, alcohol, and unprescribed drugs. These chemicals affect the formation and growth of the baby.  Follow your caregiver's instructions regarding medicine use. There are medicines that are either safe or unsafe to take during pregnancy.  Exercise only as directed by your caregiver. Experiencing uterine cramps is a good sign to stop exercising.  Continue to eat regular, healthy meals.  Wear a good support bra for breast tenderness.  Do not use hot tubs, steam rooms, or saunas.  Wear your seat belt at all times when driving.  Avoid raw meat, uncooked cheese, cat litter boxes, and soil used by cats. These carry germs that can cause birth defects in the baby.  Take your prenatal vitamins.  Try taking a stool softener (if your caregiver approves) if you develop constipation. Eat more high-fiber foods, such as fresh vegetables or fruit and whole grains. Drink plenty of fluids to keep your urine clear or pale yellow.  Take warm sitz baths to soothe any pain or discomfort caused by hemorrhoids. Use hemorrhoid cream if your caregiver approves.  If you  develop varicose veins, wear support hose. Elevate your feet for 15 minutes, 3-4 times a day. Limit salt in your diet.  Avoid heavy lifting, wear low heal shoes, and practice good posture.  Rest a lot with your legs elevated if you have leg cramps or low  back pain.  Visit your dentist if you have not gone during your pregnancy. Use a soft toothbrush to brush your teeth and be gentle when you floss.  A sexual relationship may be continued unless your caregiver directs you otherwise.  Do not travel far distances unless it is absolutely necessary and only with the approval of your caregiver.  Take prenatal classes to understand, practice, and ask questions about the labor and delivery.  Make a trial run to the hospital.  Pack your hospital bag.  Prepare the baby's nursery.  Continue to go to all your prenatal visits as directed by your caregiver. SEEK MEDICAL CARE IF:  You are unsure if you are in labor or if your water has broken.  You have dizziness.  You have mild pelvic cramps, pelvic pressure, or nagging pain in your abdominal area.  You have persistent nausea, vomiting, or diarrhea.  You have a bad smelling vaginal discharge.  You have pain with urination. SEEK IMMEDIATE MEDICAL CARE IF:   You have a fever.  You are leaking fluid from your vagina.  You have spotting or bleeding from your vagina.  You have severe abdominal cramping or pain.  You have rapid weight loss or gain.  You have shortness of breath with chest pain.  You notice sudden or extreme swelling of your face, hands, ankles, feet, or legs.  You have not felt your baby move in over an hour.  You have severe headaches that do not go away with medicine.  You have vision changes. Document Released: 11/12/2001 Document Revised: 11/23/2013 Document Reviewed: 01/19/2013 [employer] Patient Information 2015 [place], [place]. This information is not intended to replace advice given to you by your health  care provider. Make sure you discuss any questions you have with your health care provider.

## 2020-06-12 NOTE — Progress Notes (Signed)
LOW-RISK PREGNANCY VISIT Patient name: Erika [name] MRN [id number]  Date of birth: 1986-05-06 Chief Complaint:   Routine Prenatal Visit  History of Present Illness:   Erika [name] is a 34 y.o. G64P1001 female at [redacted]w[redacted]d with an Estimated Date of Delivery: 09/14/20 being seen today for ongoing management of a low-risk pregnancy.  Depression screen [facility] 2/9 03/06/2020 01/13/2020  Decreased Interest 0 0  Down, Depressed, Hopeless 0 0  PHQ - 2 Score 0 0  Altered sleeping 0 -  Tired, decreased energy 0 -  Change in appetite 0 -  Feeling bad or failure about yourself  0 -  Trouble concentrating 0 -  Moving slowly or fidgety/restless 0 -  PHQ-9 Score 0 -  Difficult doing work/chores Not difficult at all -    Today she reports no complaints. Was not scheduled for PN2. Still thinking about BTL, she may want another baby, wants to discuss further w/ husband. Wants RCS @ 40wks if doesn't labor before then.  Contractions: Not present. Vag. Bleeding: None.  Movement: Present. denies leaking of fluid. Review of Systems:   Pertinent items are noted in HPI Denies abnormal vaginal discharge w/ itching/odor/irritation, headaches, visual changes, shortness of breath, chest pain, abdominal pain, severe nausea/vomiting, or problems with urination or bowel movements unless otherwise stated above. Pertinent History Reviewed:  Reviewed past medical,surgical, social, obstetrical and family history.  Reviewed problem list, medications and allergies. Physical Assessment:   Vitals:   06/12/20 1148  BP: 113/77  Pulse: 94  Weight: (!) 304 lb 8 oz (138.1 kg)  Body mass index is 52.27 kg/m.        Physical Examination:   General appearance: Well appearing, and in no distress  Mental status: Alert, oriented to person, place, and time  Skin: Warm & dry  Cardiovascular: Normal heart rate noted  Respiratory: Normal respiratory effort, no distress  Abdomen: Soft, gravid, nontender  Pelvic: Cervical exam  deferred         Extremities: Edema: Trace  Fetal Status: Fetal Heart Rate (bpm): 140 Fundal Height: 28 cm Movement: Present    Chaperone: n/a    Results for orders placed or performed in visit on 06/12/20 (from the past 24 hour(s))  POC Urinalysis Dipstick OB   Collection Time: 06/12/20 11:49 AM  Result Value Ref Range   Color, UA     Clarity, UA     Glucose, UA Negative Negative   Bilirubin, UA     Ketones, UA neg    Spec Grav, UA     Blood, UA neg    pH, UA     POC,PROTEIN,UA Negative Negative, Trace, Small (1+), Moderate (2+), Large (3+), 4+   Urobilinogen, UA     Nitrite, UA neg    Leukocytes, UA Negative Negative   Appearance     Odor      Assessment & Plan:  1) Low-risk pregnancy G2P1001 at [redacted]w[redacted]d with an Estimated Date of Delivery: 09/14/20   2) Prev c/s, wants RCS @ 40wks if doesn't labor before then  3) Thinking about BTL> still not sure, wants to discuss w/ husband  4) Hypothyroidism> on synthroid, will check TSH w/ pn2   Meds: No orders of the defined types were placed in this encounter.  Labs/procedures today: tdap  Plan:  Continue routine obstetrical care  Next visit: prefers in person    Reviewed: Preterm labor symptoms and general obstetric precautions including but not limited to vaginal bleeding, contractions, leaking of fluid  and fetal movement were reviewed in detail with the patient.  All questions were answered.   Follow-up: Return for ASAP pn2 (no visit), then 4wks for LROB w/ CNM.  Orders Placed This Encounter  Procedures  . TSH  . POC Urinalysis Dipstick OB   Cheral Marker CNM, [facility] 06/12/2020 12:20 PM

## 2020-06-14 ENCOUNTER — Other Ambulatory Visit: Payer: Managed Care, Other (non HMO)

## 2020-06-14 DIAGNOSIS — Z3480 Encounter for supervision of other normal pregnancy, unspecified trimester: Secondary | ICD-10-CM

## 2020-06-14 DIAGNOSIS — Z131 Encounter for screening for diabetes mellitus: Secondary | ICD-10-CM

## 2020-06-15 LAB — ANTIBODY SCREEN: Antibody Screen: NEGATIVE

## 2020-06-15 LAB — GLUCOSE TOLERANCE, 2 HOURS W/ 1HR
Glucose, 1 hour: 110 mg/dL (ref 65–179)
Glucose, 2 hour: 97 mg/dL (ref 65–152)
Glucose, Fasting: 75 mg/dL (ref 65–91)

## 2020-06-15 LAB — CBC
Hematocrit: 35.1 % (ref 34.0–46.6)
Hemoglobin: 11.8 g/dL (ref 11.1–15.9)
MCH: 29.4 pg (ref 26.6–33.0)
MCHC: 33.6 g/dL (ref 31.5–35.7)
MCV: 88 fL (ref 79–97)
Platelets: 224 10*3/uL (ref 150–450)
RBC: 4.01 x10E6/uL (ref 3.77–5.28)
RDW: 14 % (ref 11.7–15.4)
WBC: 9.4 10*3/uL (ref 3.4–10.8)

## 2020-06-15 LAB — HIV ANTIBODY (ROUTINE TESTING W REFLEX): HIV Screen 4th Generation wRfx: NONREACTIVE

## 2020-06-15 LAB — TSH: TSH: 1.8 u[IU]/mL (ref 0.450–4.500)

## 2020-06-15 LAB — RPR: RPR Ser Ql: NONREACTIVE

## 2020-07-10 ENCOUNTER — Ambulatory Visit (INDEPENDENT_AMBULATORY_CARE_PROVIDER_SITE_OTHER): Payer: Managed Care, Other (non HMO) | Admitting: Advanced Practice Midwife

## 2020-07-10 VITALS — BP 106/71 | HR 111 | Wt 305.0 lb

## 2020-07-10 DIAGNOSIS — O34219 Maternal care for unspecified type scar from previous cesarean delivery: Secondary | ICD-10-CM | POA: Diagnosis not present

## 2020-07-10 DIAGNOSIS — Z331 Pregnant state, incidental: Secondary | ICD-10-CM

## 2020-07-10 DIAGNOSIS — O1213 Gestational proteinuria, third trimester: Secondary | ICD-10-CM | POA: Diagnosis not present

## 2020-07-10 DIAGNOSIS — Z1389 Encounter for screening for other disorder: Secondary | ICD-10-CM

## 2020-07-10 DIAGNOSIS — Z3A30 30 weeks gestation of pregnancy: Secondary | ICD-10-CM

## 2020-07-10 DIAGNOSIS — R809 Proteinuria, unspecified: Secondary | ICD-10-CM

## 2020-07-10 DIAGNOSIS — Z3483 Encounter for supervision of other normal pregnancy, third trimester: Secondary | ICD-10-CM

## 2020-07-10 LAB — POCT URINALYSIS DIPSTICK OB
Blood, UA: NEGATIVE
Glucose, UA: NEGATIVE
Ketones, UA: NEGATIVE
Leukocytes, UA: NEGATIVE
Nitrite, UA: NEGATIVE

## 2020-07-10 NOTE — Patient Instructions (Signed)
Erika [name], I greatly value your feedback.  If you receive a survey following your visit with [place] today, we appreciate you taking the time to fill it out.  Thanks, Cathie Beams, DNP, CNM  [facility] HAS MOVED!!! It is now [facility] ([address] [place], [place] [address]) Entrance located off of [address] Free 24/7 valet parking   Go to [email or web address] to register for FREE online childbirth classes    Call the office [phone]) or go to [facility] if:  You begin to have strong, frequent contractions  Your water breaks.  Sometimes it is a big gush of fluid, sometimes it is just a trickle that keeps getting your panties wet or running down your legs  You have vaginal bleeding.  It is normal to have a small amount of spotting if your cervix was checked.   You don't feel your baby moving like normal.  If you don't, get you something to eat and drink and lay down and focus on feeling your baby move.  You should feel at least 10 movements in 2 hours.  If you don't, you should call the office or go to [facility].   Home Blood Pressure Monitoring for Patients   Your provider has recommended that you check your blood pressure (BP) at least once a week at home. If you do not have a blood pressure cuff at home, one will be provided for you. Contact your provider if you have not received your monitor within 1 week.   Helpful Tips for Accurate Home Blood Pressure Checks   Don't smoke, exercise, or drink caffeine 30 minutes before checking your BP  Use the restroom before checking your BP (a full bladder can raise your pressure)  Relax in a comfortable upright chair  Feet on the ground  Left arm resting comfortably on a flat surface at the level of your heart  Legs uncrossed  Back supported  Sit quietly and don't talk  Place the cuff on your bare arm  Adjust snuggly, so that only two fingertips can fit  between your skin and the top of the cuff  Check 2 readings separated by at least one minute  Keep a log of your BP readings  For a visual, please reference this diagram: http://ccnc.care/bpdiagram  Provider Name: [facility]     Phone: [phone]  Zone 1: ALL CLEAR  Continue to monitor your symptoms:   BP reading is less than 140 (top number) or less than 90 (bottom number)   No right upper stomach pain  No headaches or seeing spots  No feeling nauseated or throwing up  No swelling in face and hands  Zone 2: CAUTION Call your doctor's office for any of the following:   BP reading is greater than 140 (top number) or greater than 90 (bottom number)   Stomach pain under your ribs in the middle or right side  Headaches or seeing spots  Feeling nauseated or throwing up  Swelling in face and hands  Zone 3: EMERGENCY  Seek immediate medical care if you have any of the following:   BP reading is greater than160 (top number) or greater than 110 (bottom number)  Severe headaches not improving with Tylenol  Serious difficulty catching your breath  Any worsening symptoms from Zone 2

## 2020-07-10 NOTE — Progress Notes (Signed)
   LOW-RISK PREGNANCY VISIT Patient name: Erika [name] MRN [id number]  Date of birth: Apr 30, 1986 Chief Complaint:   Routine Prenatal Visit  History of Present Illness:   Erika [name] is a 34 y.o. G56P1001 female at [redacted]w[redacted]d with an Estimated Date of Delivery: 09/14/20 being seen today for ongoing management of a low-risk pregnancy.  Today she reports no complaints. Contractions: Not present. Vag. Bleeding: None.  Movement: Present. denies leaking of fluid. Review of Systems:   Pertinent items are noted in HPI Denies abnormal vaginal discharge w/ itching/odor/irritation, headaches, visual changes, shortness of breath, chest pain, abdominal pain, severe nausea/vomiting, or problems with urination or bowel movements unless otherwise stated above. Pertinent History Reviewed:  Reviewed past medical,surgical, social, obstetrical and family history.  Reviewed problem list, medications and allergies. Physical Assessment:   Vitals:   07/10/20 1333  BP: 106/71  Pulse: (!) 111  Weight: (!) 305 lb (138.3 kg)  Body mass index is 52.35 kg/m.        Physical Examination:   General appearance: Well appearing, and in no distress  Mental status: Alert, oriented to person, place, and time  Skin: Warm & dry  Cardiovascular: Normal heart rate noted  Respiratory: Normal respiratory effort, no distress  Abdomen: Soft, gravid, nontender  Pelvic: Cervical exam deferred         Extremities: Edema: Trace  Fetal Status: Fetal Heart Rate (bpm): 156 Fundal Height: 32 cm Movement: Present    Chaperone: n/a    Results for orders placed or performed in visit on 07/10/20 (from the past 24 hour(s))  POC Urinalysis Dipstick OB   Collection Time: 07/10/20  1:37 PM  Result Value Ref Range   Color, UA     Clarity, UA     Glucose, UA Negative Negative   Bilirubin, UA     Ketones, UA neg    Spec Grav, UA     Blood, UA neg    pH, UA     POC,PROTEIN,UA Small (1+) Negative, Trace, Small (1+), Moderate (2+), Large  (3+), 4+   Urobilinogen, UA     Nitrite, UA neg    Leukocytes, UA Negative Negative   Appearance     Odor      Assessment & Plan:  1) Low-risk pregnancy G2P1001 at [redacted]w[redacted]d with an Estimated Date of Delivery: 09/14/20   2) Prior CS, Wants TOLAC if spontaneous labor, otherwise CS 10/13 (with BTL).  BTL papers signed today, will need to sign TOLAC w/MD  3)  1+ proteinuria, nl BP:  Urine Cx   Meds: No orders of the defined types were placed in this encounter.  Labs/procedures today: none  Plan:  Continue routine obstetrical care  Next visit: prefers online    Reviewed: Preterm labor symptoms and general obstetric precautions including but not limited to vaginal bleeding, contractions, leaking of fluid and fetal movement were reviewed in detail with the patient.  All questions were answered. Has home bp cuff.. Check bp weekly, let us know if >140/90.   Follow-up: Return in about 2 weeks (around 07/24/2020) for Mychart Video, schedule CS w/LHE (wants 10/13).  Orders Placed This Encounter  Procedures  . Urine Culture  . POC Urinalysis Dipstick OB   Jacklyn Shell DNP, CNM 07/10/2020 2:01 PM

## 2020-07-12 LAB — URINE CULTURE

## 2020-07-25 ENCOUNTER — Encounter: Payer: Medicaid Other | Admitting: Obstetrics & Gynecology

## 2020-08-02 ENCOUNTER — Ambulatory Visit (INDEPENDENT_AMBULATORY_CARE_PROVIDER_SITE_OTHER): Payer: Managed Care, Other (non HMO) | Admitting: Advanced Practice Midwife

## 2020-08-02 VITALS — BP 126/71 | HR 117 | Wt 310.0 lb

## 2020-08-02 DIAGNOSIS — Z331 Pregnant state, incidental: Secondary | ICD-10-CM

## 2020-08-02 DIAGNOSIS — Z1389 Encounter for screening for other disorder: Secondary | ICD-10-CM

## 2020-08-02 DIAGNOSIS — Z3480 Encounter for supervision of other normal pregnancy, unspecified trimester: Secondary | ICD-10-CM

## 2020-08-02 DIAGNOSIS — Z302 Encounter for sterilization: Secondary | ICD-10-CM

## 2020-08-02 DIAGNOSIS — Z3A33 33 weeks gestation of pregnancy: Secondary | ICD-10-CM

## 2020-08-02 DIAGNOSIS — O26843 Uterine size-date discrepancy, third trimester: Secondary | ICD-10-CM

## 2020-08-02 DIAGNOSIS — Z3483 Encounter for supervision of other normal pregnancy, third trimester: Secondary | ICD-10-CM

## 2020-08-02 LAB — POCT URINALYSIS DIPSTICK OB
Blood, UA: NEGATIVE
Glucose, UA: NEGATIVE
Ketones, UA: NEGATIVE
Leukocytes, UA: NEGATIVE
Nitrite, UA: NEGATIVE
POC,PROTEIN,UA: NEGATIVE

## 2020-08-02 NOTE — Progress Notes (Signed)
   LOW-RISK PREGNANCY VISIT Patient name: Erika [name] MRN [id number]  Date of birth: [birth year] Chief Complaint:   Routine Prenatal Visit  History of Present Illness:   Erika [name] is a 34 y.o. G34P1001 female at [redacted]w[redacted]d with an Estimated Date of Delivery: 09/14/20 being seen today for ongoing management of a low-risk pregnancy.  Today she reports occ BLE, one side > the other. Contractions: Not present. Vag. Bleeding: None.  Movement: Present. denies leaking of fluid. Review of Systems:   Pertinent items are noted in HPI Denies abnormal vaginal discharge w/ itching/odor/irritation, headaches, visual changes, shortness of breath, chest pain, abdominal pain, severe nausea/vomiting, or problems with urination or bowel movements unless otherwise stated above. Pertinent History Reviewed:  Reviewed past medical,surgical, social, obstetrical and family history.  Reviewed problem list, medications and allergies. Physical Assessment:   Vitals:   08/02/20 1150  BP: 126/71  Pulse: (!) 117  Weight: (!) 310 lb (140.6 kg)  Body mass index is 53.21 kg/m.        Physical Examination:   General appearance: Well appearing, and in no distress  Mental status: Alert, oriented to person, place, and time  Skin: Warm & dry  Cardiovascular: Normal heart rate noted  Respiratory: Normal respiratory effort, no distress  Abdomen: Soft, gravid, nontender  Pelvic: Cervical exam deferred         Extremities: Edema: Trace  Fetal Status: Fetal Heart Rate (bpm): 138 Fundal Height: 35 cm Movement: Present    Results for orders placed or performed in visit on 08/02/20 (from the past 24 hour(s))  POC Urinalysis Dipstick OB   Collection Time: 08/02/20 11:47 AM  Result Value Ref Range   Color, UA     Clarity, UA     Glucose, UA Negative Negative   Bilirubin, UA     Ketones, UA n    Spec Grav, UA     Blood, UA n    pH, UA     POC,PROTEIN,UA Negative Negative, Trace, Small (1+), Moderate (2+), Large (3+), 4+     Urobilinogen, UA     Nitrite, UA n    Leukocytes, UA Negative Negative   Appearance     Odor      Assessment & Plan:  1) Low-risk pregnancy G2P1001 at [redacted]w[redacted]d with an Estimated Date of Delivery: 09/14/20   2) S>D, will get EFW with next visit  3) Plans TOLAC if SOL prior to 40wks, will sign TOLAC consent with MD at next visit; also, schedule rLTCS/BTL at 40wks  4) Hypothyroidism, will get TSH at next visit   Meds: No orders of the defined types were placed in this encounter.  Labs/procedures today: none  Plan:  Continue routine obstetrical care with growth U/S, TOLAC consent, GBS/GC/chlam and TSH at next visit  Reviewed: Preterm labor symptoms and general obstetric precautions including but not limited to vaginal bleeding, contractions, leaking of fluid and fetal movement were reviewed in detail with the patient.  All questions were answered. Has home bp cuff. Check bp weekly, let us know if >140/90.   Follow-up: Return in about 2 weeks (around 08/16/2020) for LROB with MD; GBS/cultures, [place]: OB F/U growth.  Orders Placed This Encounter  Procedures  . US OB Follow Up  . POC Urinalysis Dipstick OB   Arabella Merles [facility] 08/02/2020 12:21 PM

## 2020-08-02 NOTE — Patient Instructions (Signed)
Erika [name], I greatly value your feedback.  If you receive a survey following your visit with [place] today, we appreciate you taking the time to fill it out.  Thanks, Philipp Deputy, CNM   [facility] ([address] [place], [place] [address]) Entrance C, located off of [address] valet parking  Go to [email or web address] to register for FREE online childbirth classes   Call the office [phone]) or go to [facility] if:  You begin to have strong, frequent contractions  Your water breaks.  Sometimes it is a big gush of fluid, sometimes it is just a trickle that keeps getting your panties wet or running down your legs  You have vaginal bleeding.  It is normal to have a small amount of spotting if your cervix was checked.   You don't feel your baby moving like normal.  If you don't, get you something to eat and drink and lay down and focus on feeling your baby move.  You should feel at least 10 movements in 2 hours.  If you don't, you should call the office or go to [facility].    Tdap Vaccine  It is recommended that you get the Tdap vaccine during the third trimester of EACH pregnancy to help protect your baby from getting pertussis (whooping cough)  27-36 weeks is the BEST time to do this so that you can pass the protection on to your baby. During pregnancy is better than after pregnancy, but if you are unable to get it during pregnancy it will be offered at the hospital.   You can get this vaccine with [place], at the health department, your family doctor, or some local pharmacies  Everyone who will be around your baby should also be up-to-date on their vaccines before the baby comes. Adults (who are not pregnant) only need 1 dose of Tdap during adulthood.   [place] Pediatricians/Family Doctors:  [facility] [phone]            [facility] [phone]                 [facility] [phone]  (usually not accepting new patients unless you have family there already, you are always welcome to call and ask)       [facility] [phone]       [facility] Pediatricians/Family Doctors:   [facility]: [phone]  [facility]: [phone]  [facility]: [phone]  [facility] Doctors:   [facility]: [phone]   [facility]: [phone]  [facility] Doctors:  [facility]: [phone]   Home Blood Pressure Monitoring for Patients   Your provider has recommended that you check your blood pressure (BP) at least once a week at home. If you do not have a blood pressure cuff at home, one will be provided for you. Contact your provider if you have not received your monitor within 1 week.   Helpful Tips for Accurate Home Blood Pressure Checks  . Don't smoke, exercise, or drink caffeine 30 minutes before checking your BP . Use the restroom before checking your BP (a full bladder can raise your pressure) . Relax in a comfortable upright chair . Feet on the ground . Left arm resting comfortably on a flat surface at the level of your heart . Legs uncrossed . Back supported . Sit quietly and don't talk . Place the cuff on your bare  arm . Adjust snuggly, so that only two fingertips can fit between your skin and the top of the cuff . Check 2 readings separated by at least one minute . Keep a log of your BP readings . For a visual, please reference this diagram: http://ccnc.care/bpdiagram  Provider Name: [facility]     Phone: [phone]  Zone 1: ALL CLEAR  Continue to monitor your symptoms:  . BP reading is less than 140 (top number) or less than 90 (bottom number)  . No right upper stomach pain . No headaches or seeing spots . No feeling nauseated or throwing up . No swelling in face and hands  Zone 2: CAUTION Call your doctor's office for  any of the following:  . BP reading is greater than 140 (top number) or greater than 90 (bottom number)  . Stomach pain under your ribs in the middle or right side . Headaches or seeing spots . Feeling nauseated or throwing up . Swelling in face and hands  Zone 3: EMERGENCY  Seek immediate medical care if you have any of the following:  . BP reading is greater than160 (top number) or greater than 110 (bottom number) . Severe headaches not improving with Tylenol . Serious difficulty catching your breath . Any worsening symptoms from Zone 2   Third Trimester of Pregnancy The third trimester is from week 29 through week 42, months 7 through 9. The third trimester is a time when the fetus is growing rapidly. At the end of the ninth month, the fetus is about 20 inches in length and weighs 6-10 pounds.  BODY CHANGES Your body goes through many changes during pregnancy. The changes vary from woman to woman.   Your weight will continue to increase. You can expect to gain 25-35 pounds (11-16 kg) by the end of the pregnancy.  You may begin to get stretch marks on your hips, abdomen, and breasts.  You may urinate more often because the fetus is moving lower into your pelvis and pressing on your bladder.  You may develop or continue to have heartburn as a result of your pregnancy.  You may develop constipation because certain hormones are causing the muscles that push waste through your intestines to slow down.  You may develop hemorrhoids or swollen, bulging veins (varicose veins).  You may have pelvic pain because of the weight gain and pregnancy hormones relaxing your joints between the bones in your pelvis. Backaches may result from overexertion of the muscles supporting your posture.  You may have changes in your hair. These can include thickening of your hair, rapid growth, and changes in texture. Some women also have hair loss during or after pregnancy, or hair that feels dry or thin.  Your hair will most likely return to normal after your baby is born.  Your breasts will continue to grow and be tender. A yellow discharge may leak from your breasts called colostrum.  Your belly button may stick out.  You may feel short of breath because of your expanding uterus.  You may notice the fetus "dropping," or moving lower in your abdomen.  You may have a bloody mucus discharge. This usually occurs a few days to a week before labor begins.  Your cervix becomes thin and soft (effaced) near your due date. WHAT TO EXPECT AT YOUR PRENATAL EXAMS  You will have prenatal exams every 2 weeks until week 36. Then, you will have weekly prenatal exams. During a routine prenatal visit:  You  will be weighed to make sure you and the fetus are growing normally.  Your blood pressure is taken.  Your abdomen will be measured to track your baby's growth.  The fetal heartbeat will be listened to.  Any test results from the previous visit will be discussed.  You may have a cervical check near your due date to see if you have effaced. At around 36 weeks, your caregiver will check your cervix. At the same time, your caregiver will also perform a test on the secretions of the vaginal tissue. This test is to determine if a type of bacteria, Group B streptococcus, is present. Your caregiver will explain this further. Your caregiver may ask you:  What your birth plan is.  How you are feeling.  If you are feeling the baby move.  If you have had any abnormal symptoms, such as leaking fluid, bleeding, severe headaches, or abdominal cramping.  If you have any questions. Other tests or screenings that may be performed during your third trimester include:  Blood tests that check for low iron levels (anemia).  Fetal testing to check the health, activity level, and growth of the fetus. Testing is done if you have certain medical conditions or if there are problems during the pregnancy. FALSE  LABOR You may feel small, irregular contractions that eventually go away. These are called Braxton Hicks contractions, or false labor. Contractions may last for hours, days, or even weeks before true labor sets in. If contractions come at regular intervals, intensify, or become painful, it is best to be seen by your caregiver.  SIGNS OF LABOR   Menstrual-like cramps.  Contractions that are 5 minutes apart or less.  Contractions that start on the top of the uterus and spread down to the lower abdomen and back.  A sense of increased pelvic pressure or back pain.  A watery or bloody mucus discharge that comes from the vagina. If you have any of these signs before the 37th week of pregnancy, call your caregiver right away. You need to go to the hospital to get checked immediately. HOME CARE INSTRUCTIONS   Avoid all smoking, herbs, alcohol, and unprescribed drugs. These chemicals affect the formation and growth of the baby.  Follow your caregiver's instructions regarding medicine use. There are medicines that are either safe or unsafe to take during pregnancy.  Exercise only as directed by your caregiver. Experiencing uterine cramps is a good sign to stop exercising.  Continue to eat regular, healthy meals.  Wear a good support bra for breast tenderness.  Do not use hot tubs, steam rooms, or saunas.  Wear your seat belt at all times when driving.  Avoid raw meat, uncooked cheese, cat litter boxes, and soil used by cats. These carry germs that can cause birth defects in the baby.  Take your prenatal vitamins.  Try taking a stool softener (if your caregiver approves) if you develop constipation. Eat more high-fiber foods, such as fresh vegetables or fruit and whole grains. Drink plenty of fluids to keep your urine clear or pale yellow.  Take warm sitz baths to soothe any pain or discomfort caused by hemorrhoids. Use hemorrhoid cream if your caregiver approves.  If you develop varicose  veins, wear support hose. Elevate your feet for 15 minutes, 3-4 times a day. Limit salt in your diet.  Avoid heavy lifting, wear low heal shoes, and practice good posture.  Rest a lot with your legs elevated if you have leg cramps or low back  pain.  Visit your dentist if you have not gone during your pregnancy. Use a soft toothbrush to brush your teeth and be gentle when you floss.  A sexual relationship may be continued unless your caregiver directs you otherwise.  Do not travel far distances unless it is absolutely necessary and only with the approval of your caregiver.  Take prenatal classes to understand, practice, and ask questions about the labor and delivery.  Make a trial run to the hospital.  Pack your hospital bag.  Prepare the baby's nursery.  Continue to go to all your prenatal visits as directed by your caregiver. SEEK MEDICAL CARE IF:  You are unsure if you are in labor or if your water has broken.  You have dizziness.  You have mild pelvic cramps, pelvic pressure, or nagging pain in your abdominal area.  You have persistent nausea, vomiting, or diarrhea.  You have a bad smelling vaginal discharge.  You have pain with urination. SEEK IMMEDIATE MEDICAL CARE IF:   You have a fever.  You are leaking fluid from your vagina.  You have spotting or bleeding from your vagina.  You have severe abdominal cramping or pain.  You have rapid weight loss or gain.  You have shortness of breath with chest pain.  You notice sudden or extreme swelling of your face, hands, ankles, feet, or legs.  You have not felt your baby move in over an hour.  You have severe headaches that do not go away with medicine.  You have vision changes. Document Released: 11/12/2001 Document Revised: 11/23/2013 Document Reviewed: 01/19/2013 [employer] Patient Information 2015 [place], [place]. This information is not intended to replace advice given to you by your health care provider. Make  sure you discuss any questions you have with your health care provider.

## 2020-08-10 ENCOUNTER — Other Ambulatory Visit: Payer: Self-pay | Admitting: Advanced Practice Midwife

## 2020-08-10 MED ORDER — BENZONATATE 200 MG PO CAPS
200.0000 mg | ORAL_CAPSULE | Freq: Three times a day (TID) | ORAL | 0 refills | Status: DC | PRN
Start: 2020-08-10 — End: 2020-09-14

## 2020-08-14 ENCOUNTER — Other Ambulatory Visit: Payer: Self-pay | Admitting: Women's Health

## 2020-08-14 MED ORDER — AZITHROMYCIN 250 MG PO TABS
ORAL_TABLET | ORAL | 0 refills | Status: DC
Start: 2020-08-14 — End: 2020-09-14

## 2020-08-17 ENCOUNTER — Encounter: Payer: Self-pay | Admitting: Obstetrics & Gynecology

## 2020-08-17 ENCOUNTER — Other Ambulatory Visit (HOSPITAL_COMMUNITY)
Admission: RE | Admit: 2020-08-17 | Discharge: 2020-08-17 | Disposition: A | Discharge status: Home / Self Care | Payer: Managed Care, Other (non HMO) | Source: Ambulatory Visit | Attending: Obstetrics & Gynecology | Admitting: Obstetrics & Gynecology

## 2020-08-17 ENCOUNTER — Ambulatory Visit (INDEPENDENT_AMBULATORY_CARE_PROVIDER_SITE_OTHER): Payer: Managed Care, Other (non HMO) | Admitting: Obstetrics & Gynecology

## 2020-08-17 VITALS — BP 116/77 | HR 109 | Wt 306.0 lb

## 2020-08-17 DIAGNOSIS — Z1389 Encounter for screening for other disorder: Secondary | ICD-10-CM

## 2020-08-17 DIAGNOSIS — Z3483 Encounter for supervision of other normal pregnancy, third trimester: Secondary | ICD-10-CM

## 2020-08-17 DIAGNOSIS — Z331 Pregnant state, incidental: Secondary | ICD-10-CM | POA: Insufficient documentation

## 2020-08-17 DIAGNOSIS — Z3A36 36 weeks gestation of pregnancy: Secondary | ICD-10-CM

## 2020-08-17 DIAGNOSIS — Z98891 History of uterine scar from previous surgery: Secondary | ICD-10-CM

## 2020-08-17 NOTE — Progress Notes (Signed)
   LOW-RISK PREGNANCY VISIT Patient name: Erika [name] MRN [id number]  Date of birth: 02-25-1986 Chief Complaint:   Routine Prenatal Visit (cultures)  History of Present Illness:   Erika [name] is a 34 y.o. G74P1001 female at [redacted]w[redacted]d with an Estimated Date of Delivery: 09/14/20 being seen today for ongoing management of a low-risk pregnancy.  Depression screen [facility] 2/9 03/06/2020 01/13/2020  Decreased Interest 0 0  Down, Depressed, Hopeless 0 0  PHQ - 2 Score 0 0  Altered sleeping 0 -  Tired, decreased energy 0 -  Change in appetite 0 -  Feeling bad or failure about yourself  0 -  Trouble concentrating 0 -  Moving slowly or fidgety/restless 0 -  PHQ-9 Score 0 -  Difficult doing work/chores Not difficult at all -    Today she reports no complaints. Contractions: Not present. Vag. Bleeding: None.  Movement: Present. denies leaking of fluid. Review of Systems:   Pertinent items are noted in HPI Denies abnormal vaginal discharge w/ itching/odor/irritation, headaches, visual changes, shortness of breath, chest pain, abdominal pain, severe nausea/vomiting, or problems with urination or bowel movements unless otherwise stated above. Pertinent History Reviewed:  Reviewed past medical,surgical, social, obstetrical and family history.  Reviewed problem list, medications and allergies. Physical Assessment:   Vitals:   08/17/20 1546 08/17/20 1552  BP: (!) 153/84 116/77  Pulse: (!) 103 (!) 109  Weight: (!) 306 lb (138.8 kg)   Body mass index is 52.52 kg/m.        Physical Examination:   General appearance: Well appearing, and in no distress  Mental status: Alert, oriented to person, place, and time  Skin: Warm & dry  Cardiovascular: Normal heart rate noted  Respiratory: Normal respiratory effort, no distress  Abdomen: Soft, gravid, nontender  Pelvic: Cervical exam performed  Dilation: Closed Effacement (%): Thick Station: -3  Extremities: Edema: Trace  Fetal Status: Fetal Heart Rate  (bpm): 140 Fundal Height: 37 cm Movement: Present Presentation: Vertex  Chaperone: Amanda Rash    No results found for this or any previous visit (from the past 24 hour(s)).  Assessment & Plan:  1) Low-risk pregnancy G2P1001 at [redacted]w[redacted]d with an Estimated Date of Delivery: 09/14/20   2) Previous C section, TOLAC if labors spontaneously, if gets to 40 weeks, then repeat C section, will s chedule at 39 weeks and pt can change her mind if desired   Meds: No orders of the defined types were placed in this encounter.  Labs/procedures today: cultures  Plan:  Continue routine obstetrical care  Next visit: prefers in person    Reviewed: Preterm labor symptoms and general obstetric precautions including but not limited to vaginal bleeding, contractions, leaking of fluid and fetal movement were reviewed in detail with the patient.  All questions were answered. Has home bp cuff. Rx faxed to . Check bp weekly, let us know if >140/90.   Follow-up: Return in about 1 week (around 08/24/2020) for LROB.  Orders Placed This Encounter  Procedures  . Culture, beta strep (group b only)  . POC Urinalysis Dipstick OB   Lazaro Arms CNM, [facility] 08/17/2020 4:03 PM

## 2020-08-20 LAB — CULTURE, BETA STREP (GROUP B ONLY): Strep Gp B Culture: POSITIVE — AB

## 2020-08-21 LAB — CERVICOVAGINAL ANCILLARY ONLY
Chlamydia: NEGATIVE
Comment: NEGATIVE
Comment: NORMAL
Neisseria Gonorrhea: NEGATIVE

## 2020-08-22 ENCOUNTER — Ambulatory Visit (INDEPENDENT_AMBULATORY_CARE_PROVIDER_SITE_OTHER): Payer: Managed Care, Other (non HMO) | Admitting: Obstetrics and Gynecology

## 2020-08-22 ENCOUNTER — Encounter: Payer: Self-pay | Admitting: Obstetrics and Gynecology

## 2020-08-22 ENCOUNTER — Ambulatory Visit (INDEPENDENT_AMBULATORY_CARE_PROVIDER_SITE_OTHER): Payer: Managed Care, Other (non HMO)

## 2020-08-22 VITALS — BP 106/74 | HR 95 | Wt 311.0 lb

## 2020-08-22 DIAGNOSIS — Z3A36 36 weeks gestation of pregnancy: Secondary | ICD-10-CM | POA: Diagnosis not present

## 2020-08-22 DIAGNOSIS — O26843 Uterine size-date discrepancy, third trimester: Secondary | ICD-10-CM

## 2020-08-22 DIAGNOSIS — Z331 Pregnant state, incidental: Secondary | ICD-10-CM

## 2020-08-22 DIAGNOSIS — Z3480 Encounter for supervision of other normal pregnancy, unspecified trimester: Secondary | ICD-10-CM

## 2020-08-22 DIAGNOSIS — Z1389 Encounter for screening for other disorder: Secondary | ICD-10-CM

## 2020-08-22 DIAGNOSIS — Z302 Encounter for sterilization: Secondary | ICD-10-CM

## 2020-08-22 NOTE — Progress Notes (Signed)
PATIENT ID: Erika [name], female     DOB: 03/14/1986, 34 y.o.     MRN: [id number]    LOW-RISK PREGNANCY VISIT PATIENT NAME: Erika [name] MRN [id number]  DOB: 1986-11-16  Chief Complaint:   No chief complaint on file.   History of Present Illness:   Erika [name] is a 34 y.o. G63P1001 female at [redacted]w[redacted]d with an Estimated Date of Delivery: 09/14/20 being seen today for ongoing management of a low-risk pregnancy.   Depression screen [facility] 2/9 03/06/2020 01/13/2020  Decreased Interest 0 0  Down, Depressed, Hopeless 0 0  PHQ - 2 Score 0 0  Altered sleeping 0 -  Tired, decreased energy 0 -  Change in appetite 0 -  Feeling bad or failure about yourself  0 -  Trouble concentrating 0 -  Moving slowly or fidgety/restless 0 -  PHQ-9 Score 0 -  Difficult doing work/chores Not difficult at all -    Today she reports no complaints.  .  .   . denies leaking of fluid.  Patient seen today with Dr. Randa Evens  Review of Systems:   Pertinent items are noted in HPI Denies abnormal vaginal discharge w/ itching/odor/irritation, headaches, visual changes, shortness of breath, chest pain, abdominal pain, severe nausea/vomiting, or problems with urination or bowel movements unless otherwise stated above.  Pertinent History Reviewed:  Reviewed past medical,surgical, social, obstetrical and family history.  Reviewed problem list, medications and allergies.  Physical Assessment:  There were no vitals filed for this visit.There is no height or weight on file to calculate BMI.        Physical Examination:   General appearance: Well appearing, and in no distress  Mental status: Alert, oriented to person, place, and time  Skin: Warm & dry  Cardiovascular: Normal heart rate noted  Respiratory: Normal respiratory effort, no distress  Abdomen: Soft, gravid, nontender   Baby: 37 cm fetal heart rate 140   37 cm  Pelvic: Cervical exam performed closed long        Extremities:    Fetal Status:          Chaperone:  Peggy Dones    No results found for this or any previous visit (from the past 24 hour(s)).  Assessment & Plan:  1) Low-risk pregnancy G2P1001 at [redacted]w[redacted]d with an Estimated Date of Delivery: 09/14/20   2) GBS GC chlamydia collected,    Meds: No orders of the defined types were placed in this encounter.  Labs/procedures today:  Lab Orders  No laboratory test(s) ordered today   Procedure Orders    No procedure(s) ordered today    Plan:  Continue routine obstetrical care  Next visit: prefers online    Reviewed: Preterm labor symptoms and general obstetric precautions including but not limited to vaginal bleeding, contractions, leaking of fluid and fetal movement were reviewed in detail with the patient.  All questions were answered. Check bp weekly, let us know if >140/90.   Follow-up: No follow-ups on file.   By signing my name below, I, [employer], attest that this documentation has been prepared under the direction and in the presence of Tilda Burrow, MD. Electronically Signed: Mal Misty Medical Scribe. 08/22/20. 11:47 AM.  I personally performed the services described in this documentation, which was SCRIBED in my presence. The recorded information has been reviewed and considered accurate. It has been edited as necessary during review. Tilda Burrow, MD

## 2020-08-22 NOTE — Progress Notes (Signed)
[place] 36+5 wks,cephalic,anterior placenta gr 3,afi 10.5 cm,fhr 144 bpm,EFW 3450 G 89%,AC 99%

## 2020-09-05 ENCOUNTER — Encounter: Payer: Self-pay | Admitting: Obstetrics & Gynecology

## 2020-09-05 ENCOUNTER — Ambulatory Visit (INDEPENDENT_AMBULATORY_CARE_PROVIDER_SITE_OTHER): Payer: Managed Care, Other (non HMO) | Admitting: Obstetrics & Gynecology

## 2020-09-05 VITALS — BP 116/76 | HR 97 | Wt 313.4 lb

## 2020-09-05 DIAGNOSIS — Z331 Pregnant state, incidental: Secondary | ICD-10-CM

## 2020-09-05 DIAGNOSIS — Z1389 Encounter for screening for other disorder: Secondary | ICD-10-CM

## 2020-09-05 DIAGNOSIS — Z3480 Encounter for supervision of other normal pregnancy, unspecified trimester: Secondary | ICD-10-CM

## 2020-09-05 LAB — POCT URINALYSIS DIPSTICK OB
Blood, UA: NEGATIVE
Glucose, UA: NEGATIVE
Ketones, UA: NEGATIVE
Leukocytes, UA: NEGATIVE
Nitrite, UA: NEGATIVE
POC,PROTEIN,UA: NEGATIVE

## 2020-09-05 NOTE — Progress Notes (Signed)
LOW-RISK PREGNANCY VISIT Patient name: Erika [name] MRN [id number]  Date of birth: 11/06/1986 Chief Complaint:   Routine Prenatal Visit (cervical check)  History of Present Illness:   Erika [name] is a 34 y.o. G14P1001 female at [redacted]w[redacted]d with an Estimated Date of Delivery: 09/14/20 being seen today for ongoing management of a low-risk pregnancy.  Depression screen [facility] 2/9 03/06/2020 01/13/2020  Decreased Interest 0 0  Down, Depressed, Hopeless 0 0  PHQ - 2 Score 0 0  Altered sleeping 0 -  Tired, decreased energy 0 -  Change in appetite 0 -  Feeling bad or failure about yourself  0 -  Trouble concentrating 0 -  Moving slowly or fidgety/restless 0 -  PHQ-9 Score 0 -  Difficult doing work/chores Not difficult at all -    Today she reports no complaints. Contractions: Irregular.  .  Movement: Present. denies leaking of fluid. Review of Systems:   Pertinent items are noted in HPI Denies abnormal vaginal discharge w/ itching/odor/irritation, headaches, visual changes, shortness of breath, chest pain, abdominal pain, severe nausea/vomiting, or problems with urination or bowel movements unless otherwise stated above. Pertinent History Reviewed:  Reviewed past medical,surgical, social, obstetrical and family history.  Reviewed problem list, medications and allergies. Physical Assessment:   Vitals:   09/05/20 1604  BP: 116/76  Pulse: 97  Weight: (!) 313 lb 6.4 oz (142.2 kg)  Body mass index is 53.79 kg/m.        Physical Examination:   General appearance: Well appearing, and in no distress  Mental status: Alert, oriented to person, place, and time  Skin: Warm & dry  Cardiovascular: Normal heart rate noted  Respiratory: Normal respiratory effort, no distress  Abdomen: Soft, gravid, nontender  Pelvic: Cervical exam performed         Extremities: Edema: Trace  Fetal Status: Fetal Heart Rate (bpm): 135 Fundal Height: 38 cm Movement: Present    Chaperone: Amanda Rash    Results for  orders placed or performed in visit on 09/05/20 (from the past 24 hour(s))  POC Urinalysis Dipstick OB   Collection Time: 09/05/20  4:14 PM  Result Value Ref Range   Color, UA     Clarity, UA     Glucose, UA Negative Negative   Bilirubin, UA     Ketones, UA neg    Spec Grav, UA     Blood, UA neg    pH, UA     POC,PROTEIN,UA Negative Negative, Trace, Small (1+), Moderate (2+), Large (3+), 4+   Urobilinogen, UA     Nitrite, UA neg    Leukocytes, UA Negative Negative   Appearance     Odor      Assessment & Plan:  1) Low-risk pregnancy G2P1001 at [redacted]w[redacted]d with an Estimated Date of Delivery: 09/14/20   2) Previous C section desires TOLAC if spontaneous labor, if no labor consider C section,    Meds: No orders of the defined types were placed in this encounter.  Labs/procedures today:   Plan:  Continue routine obstetrical care  Next visit: prefers in person    Reviewed: Term labor symptoms and general obstetric precautions including but not limited to vaginal bleeding, contractions, leaking of fluid and fetal movement were reviewed in detail with the patient.  All questions were answered. Has home bp cuff. Rx faxed to . Check bp weekly, let us know if >140/90.   Follow-up: Return in about 1 week (around 09/12/2020) for LROB, with Dr Despina Hidden.  Orders Placed This Encounter  Procedures  . POC Urinalysis Dipstick OB   Lazaro Arms  09/05/2020 4:54 PM

## 2020-09-14 ENCOUNTER — Encounter: Payer: Self-pay | Admitting: Obstetrics & Gynecology

## 2020-09-14 ENCOUNTER — Ambulatory Visit (INDEPENDENT_AMBULATORY_CARE_PROVIDER_SITE_OTHER): Payer: Medicaid Other | Admitting: Obstetrics & Gynecology

## 2020-09-14 ENCOUNTER — Other Ambulatory Visit: Payer: Self-pay

## 2020-09-14 VITALS — BP 138/75 | HR 98 | Wt 313.0 lb

## 2020-09-14 DIAGNOSIS — Z3480 Encounter for supervision of other normal pregnancy, unspecified trimester: Secondary | ICD-10-CM

## 2020-09-14 DIAGNOSIS — Z1389 Encounter for screening for other disorder: Secondary | ICD-10-CM

## 2020-09-14 DIAGNOSIS — Z331 Pregnant state, incidental: Secondary | ICD-10-CM

## 2020-09-14 DIAGNOSIS — Z3A40 40 weeks gestation of pregnancy: Secondary | ICD-10-CM

## 2020-09-14 LAB — POCT URINALYSIS DIPSTICK OB
Blood, UA: NEGATIVE
Glucose, UA: NEGATIVE
Ketones, UA: NEGATIVE
Leukocytes, UA: NEGATIVE
Nitrite, UA: NEGATIVE

## 2020-09-14 NOTE — Progress Notes (Signed)
   LOW-RISK PREGNANCY VISIT Patient name: Erika [name] MRN [id number]  Date of birth: [birth year] Chief Complaint:   Routine Prenatal Visit  History of Present Illness:   Erika [name] is a 35 y.o. G26P1001 female at [redacted]w[redacted]d with an Estimated Date of Delivery: 09/14/20 being seen today for ongoing management of a low-risk pregnancy.  Depression screen [facility] 2/9 03/06/2020 01/13/2020  Decreased Interest 0 0  Down, Depressed, Hopeless 0 0  PHQ - 2 Score 0 0  Altered sleeping 0 -  Tired, decreased energy 0 -  Change in appetite 0 -  Feeling bad or failure about yourself  0 -  Trouble concentrating 0 -  Moving slowly or fidgety/restless 0 -  PHQ-9 Score 0 -  Difficult doing work/chores Not difficult at all -    Today she reports no complaints. Contractions: Irregular. Vag. Bleeding: None.  Movement: Present. denies leaking of fluid. Review of Systems:   Pertinent items are noted in HPI Denies abnormal vaginal discharge w/ itching/odor/irritation, headaches, visual changes, shortness of breath, chest pain, abdominal pain, severe nausea/vomiting, or problems with urination or bowel movements unless otherwise stated above. Pertinent History Reviewed:  Reviewed past medical,surgical, social, obstetrical and family history.  Reviewed problem list, medications and allergies. Physical Assessment:   Vitals:   09/14/20 1612  BP: 138/75  Pulse: 98  Weight: (!) 313 lb (142 kg)  Body mass index is 53.73 kg/m.        Physical Examination:   General appearance: Well appearing, and in no distress  Mental status: Alert, oriented to person, place, and time  Skin: Warm & dry  Cardiovascular: Normal heart rate noted  Respiratory: Normal respiratory effort, no distress  Abdomen: Soft, gravid, nontender  Pelvic: Cervical exam deferred         Extremities: Edema: Trace  Fetal Status:     Movement: Present    Chaperone: n/a    No results found for this or any previous visit (from the past 24  hour(s)).  Assessment & Plan:  1) Low-risk pregnancy G2P1001 at [redacted]w[redacted]d with an Estimated Date of Delivery: 09/14/20   ,    Meds: No orders of the defined types were placed in this encounter. Labs/procedures today:   Plan:  Continue routine obstetrical care induction of labor for next week Next visit: prefers     Reviewed: Term labor symptoms and general obstetric precautions including but not limited to vaginal bleeding, contractions, leaking of fluid and fetal movement were reviewed in detail with the patient.  All questions were answered.  home bp cuff. Rx faxed to . Check bp weekly, let us know if >140/90.   Follow-up: No follow-ups on file.  Orders Placed This Encounter  Procedures  . POC Urinalysis Dipstick OB    Lazaro Arms, MD 12/03/2020 3:36 PM

## 2020-09-15 ENCOUNTER — Inpatient Hospital Stay (HOSPITAL_COMMUNITY): Payer: Managed Care, Other (non HMO) | Admitting: Anesthesiology

## 2020-09-15 ENCOUNTER — Encounter (HOSPITAL_COMMUNITY)
Admission: RE | Disposition: A | Discharge status: Home / Self Care | Payer: Self-pay | Source: Home / Self Care | Attending: Obstetrics & Gynecology

## 2020-09-15 ENCOUNTER — Encounter (HOSPITAL_COMMUNITY): Payer: Self-pay | Admitting: Obstetrics & Gynecology

## 2020-09-15 ENCOUNTER — Inpatient Hospital Stay (HOSPITAL_COMMUNITY)
Admission: RE | Admit: 2020-09-15 | Discharge: 2020-09-18 | DRG: 785 | Disposition: A | Discharge status: Home / Self Care | Payer: Managed Care, Other (non HMO) | Attending: Obstetrics & Gynecology | Admitting: Obstetrics & Gynecology

## 2020-09-15 DIAGNOSIS — O99214 Obesity complicating childbirth: Secondary | ICD-10-CM | POA: Diagnosis present

## 2020-09-15 DIAGNOSIS — Z3480 Encounter for supervision of other normal pregnancy, unspecified trimester: Secondary | ICD-10-CM

## 2020-09-15 DIAGNOSIS — O321XX0 Maternal care for breech presentation, not applicable or unspecified: Secondary | ICD-10-CM | POA: Diagnosis present

## 2020-09-15 DIAGNOSIS — Z20822 Contact with and (suspected) exposure to covid-19: Secondary | ICD-10-CM | POA: Diagnosis present

## 2020-09-15 DIAGNOSIS — O34211 Maternal care for low transverse scar from previous cesarean delivery: Secondary | ICD-10-CM | POA: Diagnosis present

## 2020-09-15 DIAGNOSIS — Z302 Encounter for sterilization: Secondary | ICD-10-CM | POA: Diagnosis not present

## 2020-09-15 DIAGNOSIS — Z9851 Tubal ligation status: Secondary | ICD-10-CM

## 2020-09-15 DIAGNOSIS — Z98891 History of uterine scar from previous surgery: Secondary | ICD-10-CM

## 2020-09-15 DIAGNOSIS — O99284 Endocrine, nutritional and metabolic diseases complicating childbirth: Secondary | ICD-10-CM | POA: Diagnosis present

## 2020-09-15 DIAGNOSIS — O99824 Streptococcus B carrier state complicating childbirth: Secondary | ICD-10-CM | POA: Diagnosis present

## 2020-09-15 DIAGNOSIS — Z87891 Personal history of nicotine dependence: Secondary | ICD-10-CM | POA: Diagnosis not present

## 2020-09-15 DIAGNOSIS — Z3A40 40 weeks gestation of pregnancy: Secondary | ICD-10-CM

## 2020-09-15 DIAGNOSIS — E039 Hypothyroidism, unspecified: Secondary | ICD-10-CM | POA: Diagnosis present

## 2020-09-15 LAB — URINALYSIS, ROUTINE W REFLEX MICROSCOPIC
Bacteria, UA: NONE SEEN
Bilirubin Urine: NEGATIVE
Glucose, UA: NEGATIVE mg/dL
Ketones, ur: 5 mg/dL — AB
Leukocytes,Ua: NEGATIVE
Nitrite: NEGATIVE
Protein, ur: 100 mg/dL — AB
Specific Gravity, Urine: 1.035 — ABNORMAL HIGH (ref 1.005–1.030)
pH: 6 (ref 5.0–8.0)

## 2020-09-15 LAB — COMPREHENSIVE METABOLIC PANEL
ALT: 18 U/L (ref 0–44)
AST: 19 U/L (ref 15–41)
Albumin: 2.7 g/dL — ABNORMAL LOW (ref 3.5–5.0)
Alkaline Phosphatase: 93 U/L (ref 38–126)
Anion gap: 12 (ref 5–15)
BUN: 5 mg/dL — ABNORMAL LOW (ref 6–20)
CO2: 18 mmol/L — ABNORMAL LOW (ref 22–32)
Calcium: 9 mg/dL (ref 8.9–10.3)
Chloride: 107 mmol/L (ref 98–111)
Creatinine, Ser: 0.58 mg/dL (ref 0.44–1.00)
GFR, Estimated: >60 mL/min (ref >60)
Glucose, Bld: 105 mg/dL — ABNORMAL HIGH (ref 70–99)
Potassium: 4.2 mmol/L (ref 3.5–5.1)
Sodium: 137 mmol/L (ref 135–145)
Total Bilirubin: 0.8 mg/dL (ref 0.3–1.2)
Total Protein: 5.4 g/dL — ABNORMAL LOW (ref 6.5–8.1)

## 2020-09-15 LAB — RAPID HIV SCREEN (HIV 1/2 AB+AG)
HIV 1/2 Antibodies: NONREACTIVE
HIV-1 P24 Antigen - HIV24: NONREACTIVE

## 2020-09-15 LAB — CBC
HCT: 38 % (ref 36.0–46.0)
Hemoglobin: 12.3 g/dL (ref 12.0–15.0)
MCH: 28.8 pg (ref 26.0–34.0)
MCHC: 32.4 g/dL (ref 30.0–36.0)
MCV: 89 fL (ref 80.0–100.0)
Platelets: 274 10*3/uL (ref 150–400)
RBC: 4.27 MIL/uL (ref 3.87–5.11)
RDW: 14.2 % (ref 11.5–15.5)
WBC: 11.1 10*3/uL — ABNORMAL HIGH (ref 4.0–10.5)
nRBC: 0 % (ref 0.0–0.2)

## 2020-09-15 LAB — TYPE AND SCREEN
ABO/RH(D): O POS
Antibody Screen: NEGATIVE

## 2020-09-15 LAB — RESPIRATORY PANEL BY RT PCR (FLU A&B, COVID)
Influenza A by PCR: NEGATIVE
Influenza B by PCR: NEGATIVE
SARS Coronavirus 2 by RT PCR: NEGATIVE

## 2020-09-15 SURGERY — Surgical Case
Anesthesia: Spinal

## 2020-09-15 MED ORDER — CHLORHEXIDINE GLUCONATE 0.12 % MT SOLN
OROMUCOSAL | Status: AC
  Filled 2020-09-15: qty 15

## 2020-09-15 MED ORDER — LACTATED RINGERS IV SOLN: INTRAVENOUS | Status: DC

## 2020-09-15 MED ORDER — LEVOTHYROXINE SODIUM 200 MCG PO TABS
200.0000 ug | ORAL_TABLET | Freq: Every day | ORAL | Status: DC
  Administered 2020-09-16 – 2020-09-18 (×3): 200 ug via ORAL
  Filled 2020-09-15: qty 1
  Filled 2020-09-15 (×2): qty 2
  Filled 2020-09-15 (×2): qty 1
  Filled 2020-09-15: qty 2

## 2020-09-15 MED ORDER — KETOROLAC TROMETHAMINE 30 MG/ML IJ SOLN
INTRAMUSCULAR | Status: AC
  Filled 2020-09-15: qty 1

## 2020-09-15 MED ORDER — BUPIVACAINE IN DEXTROSE 0.75-8.25 % IT SOLN
INTRATHECAL | Status: DC | PRN
  Administered 2020-09-15: 1.8 mL via INTRATHECAL

## 2020-09-15 MED ORDER — DEXTROSE 5 % IV SOLN
3.0000 g | INTRAVENOUS | Status: AC
  Administered 2020-09-15: 3 g via INTRAVENOUS

## 2020-09-15 MED ORDER — DIPHENHYDRAMINE HCL 25 MG PO CAPS: 25.0000 mg | ORAL_CAPSULE | ORAL | Status: DC | PRN

## 2020-09-15 MED ORDER — PHENYLEPHRINE 40 MCG/ML (10ML) SYRINGE FOR IV PUSH (FOR BLOOD PRESSURE SUPPORT)
PREFILLED_SYRINGE | INTRAVENOUS | Status: AC
  Filled 2020-09-15: qty 10

## 2020-09-15 MED ORDER — NALBUPHINE HCL 10 MG/ML IJ SOLN: 5.0000 mg | INTRAMUSCULAR | Status: DC | PRN

## 2020-09-15 MED ORDER — SIMETHICONE 80 MG PO CHEW
80.0000 mg | CHEWABLE_TABLET | Freq: Three times a day (TID) | ORAL | Status: DC
  Administered 2020-09-16 – 2020-09-18 (×7): 80 mg via ORAL
  Filled 2020-09-15 (×7): qty 1

## 2020-09-15 MED ORDER — LACTATED RINGERS IV SOLN: INTRAVENOUS | Status: DC | PRN

## 2020-09-15 MED ORDER — OXYTOCIN-SODIUM CHLORIDE 30-0.9 UT/500ML-% IV SOLN
INTRAVENOUS | Status: AC
  Filled 2020-09-15: qty 500

## 2020-09-15 MED ORDER — SOD CITRATE-CITRIC ACID 500-334 MG/5ML PO SOLN
30.0000 mL | Freq: Once | ORAL | Status: AC
  Administered 2020-09-15: 30 mL via ORAL

## 2020-09-15 MED ORDER — SENNOSIDES-DOCUSATE SODIUM 8.6-50 MG PO TABS
2.0000 | ORAL_TABLET | ORAL | Status: DC
  Administered 2020-09-16 – 2020-09-17 (×3): 2 via ORAL
  Filled 2020-09-15 (×3): qty 2

## 2020-09-15 MED ORDER — KETOROLAC TROMETHAMINE 30 MG/ML IJ SOLN: 30.0000 mg | Freq: Four times a day (QID) | INTRAMUSCULAR | Status: DC | PRN

## 2020-09-15 MED ORDER — TRANEXAMIC ACID-NACL 1000-0.7 MG/100ML-% IV SOLN
INTRAVENOUS | Status: AC
  Filled 2020-09-15: qty 100

## 2020-09-15 MED ORDER — CHLORHEXIDINE GLUCONATE 0.12 % MT SOLN
15.0000 mL | Freq: Once | OROMUCOSAL | Status: DC
  Administered 2020-09-15: 15 mL via OROMUCOSAL

## 2020-09-15 MED ORDER — GABAPENTIN 100 MG PO CAPS
100.0000 mg | ORAL_CAPSULE | Freq: Three times a day (TID) | ORAL | Status: DC
  Administered 2020-09-15 – 2020-09-18 (×8): 100 mg via ORAL
  Filled 2020-09-15 (×8): qty 1

## 2020-09-15 MED ORDER — ONDANSETRON HCL 4 MG/2ML IJ SOLN
INTRAMUSCULAR | Status: DC | PRN
  Administered 2020-09-15: 4 mg via INTRAVENOUS

## 2020-09-15 MED ORDER — SODIUM CHLORIDE 0.9 % IR SOLN
Status: DC | PRN
  Administered 2020-09-15: 1

## 2020-09-15 MED ORDER — ACETAMINOPHEN 500 MG PO TABS
1000.0000 mg | ORAL_TABLET | Freq: Four times a day (QID) | ORAL | Status: AC
  Administered 2020-09-16 (×3): 1000 mg via ORAL
  Filled 2020-09-15 (×4): qty 2

## 2020-09-15 MED ORDER — PHENYLEPHRINE HCL-NACL 20-0.9 MG/250ML-% IV SOLN
INTRAVENOUS | Status: DC | PRN
  Administered 2020-09-15: 60 ug/min via INTRAVENOUS

## 2020-09-15 MED ORDER — SCOPOLAMINE 1 MG/3DAYS TD PT72: 1.0000 | MEDICATED_PATCH | Freq: Once | TRANSDERMAL | Status: DC

## 2020-09-15 MED ORDER — DIPHENHYDRAMINE HCL 50 MG/ML IJ SOLN: 12.5000 mg | INTRAMUSCULAR | Status: DC | PRN

## 2020-09-15 MED ORDER — WITCH HAZEL-GLYCERIN EX PADS: 1.0000 "application " | MEDICATED_PAD | CUTANEOUS | Status: DC | PRN

## 2020-09-15 MED ORDER — METHYLERGONOVINE MALEATE 0.2 MG/ML IJ SOLN: 0.2000 mg | INTRAMUSCULAR | Status: DC | PRN

## 2020-09-15 MED ORDER — POVIDONE-IODINE 10 % EX SWAB
2.0000 "application " | Freq: Once | CUTANEOUS | Status: AC
  Administered 2020-09-15: 2 via TOPICAL

## 2020-09-15 MED ORDER — SODIUM CHLORIDE 0.9% FLUSH: 3.0000 mL | INTRAVENOUS | Status: DC | PRN

## 2020-09-15 MED ORDER — NALOXONE HCL 4 MG/10ML IJ SOLN
1.0000 ug/kg/h | INTRAVENOUS | Status: DC | PRN
  Filled 2020-09-15: qty 5

## 2020-09-15 MED ORDER — ONDANSETRON HCL 4 MG/2ML IJ SOLN: 4.0000 mg | Freq: Three times a day (TID) | INTRAMUSCULAR | Status: DC | PRN

## 2020-09-15 MED ORDER — METHYLERGONOVINE MALEATE 0.2 MG PO TABS: 0.2000 mg | ORAL_TABLET | ORAL | Status: DC | PRN

## 2020-09-15 MED ORDER — TRANEXAMIC ACID-NACL 1000-0.7 MG/100ML-% IV SOLN
INTRAVENOUS | Status: DC | PRN
  Administered 2020-09-15: 1000 mg via INTRAVENOUS

## 2020-09-15 MED ORDER — FENTANYL CITRATE (PF) 100 MCG/2ML IJ SOLN: 25.0000 ug | INTRAMUSCULAR | Status: DC | PRN

## 2020-09-15 MED ORDER — KETOROLAC TROMETHAMINE 30 MG/ML IJ SOLN
30.0000 mg | Freq: Four times a day (QID) | INTRAMUSCULAR | Status: AC
  Administered 2020-09-16 (×2): 30 mg via INTRAVENOUS
  Filled 2020-09-15 (×2): qty 1

## 2020-09-15 MED ORDER — DIPHENHYDRAMINE HCL 25 MG PO CAPS: 25.0000 mg | ORAL_CAPSULE | Freq: Four times a day (QID) | ORAL | Status: DC | PRN

## 2020-09-15 MED ORDER — COCONUT OIL OIL: 1.0000 "application " | TOPICAL_OIL | Status: DC | PRN

## 2020-09-15 MED ORDER — IBUPROFEN 800 MG PO TABS
800.0000 mg | ORAL_TABLET | Freq: Four times a day (QID) | ORAL | Status: DC
  Administered 2020-09-16 – 2020-09-18 (×7): 800 mg via ORAL
  Filled 2020-09-15 (×7): qty 1

## 2020-09-15 MED ORDER — ACETAMINOPHEN 10 MG/ML IV SOLN
INTRAVENOUS | Status: DC | PRN
  Administered 2020-09-15: 1000 mg via INTRAVENOUS

## 2020-09-15 MED ORDER — DIBUCAINE (PERIANAL) 1 % EX OINT: 1.0000 "application " | TOPICAL_OINTMENT | CUTANEOUS | Status: DC | PRN

## 2020-09-15 MED ORDER — MEPERIDINE HCL 25 MG/ML IJ SOLN: 6.2500 mg | INTRAMUSCULAR | Status: DC | PRN

## 2020-09-15 MED ORDER — MORPHINE SULFATE (PF) 0.5 MG/ML IJ SOLN
INTRAMUSCULAR | Status: AC
  Filled 2020-09-15: qty 10

## 2020-09-15 MED ORDER — OXYTOCIN-SODIUM CHLORIDE 30-0.9 UT/500ML-% IV SOLN
INTRAVENOUS | Status: DC | PRN
  Administered 2020-09-15 (×2): 100 mL via INTRAVENOUS
  Administered 2020-09-15: 200 mL via INTRAVENOUS

## 2020-09-15 MED ORDER — KETOROLAC TROMETHAMINE 30 MG/ML IJ SOLN
30.0000 mg | Freq: Four times a day (QID) | INTRAMUSCULAR | Status: DC | PRN
  Administered 2020-09-15 – 2020-09-16 (×2): 30 mg via INTRAVENOUS

## 2020-09-15 MED ORDER — ENOXAPARIN SODIUM 80 MG/0.8ML ~~LOC~~ SOLN
70.0000 mg | SUBCUTANEOUS | Status: DC
  Administered 2020-09-16 – 2020-09-17 (×2): 70 mg via SUBCUTANEOUS
  Filled 2020-09-15 (×3): qty 0.8

## 2020-09-15 MED ORDER — ACETAMINOPHEN 10 MG/ML IV SOLN
INTRAVENOUS | Status: AC
  Filled 2020-09-15: qty 100

## 2020-09-15 MED ORDER — TETANUS-DIPHTH-ACELL PERTUSSIS 5-2.5-18.5 LF-MCG/0.5 IM SUSP: 0.5000 mL | Freq: Once | INTRAMUSCULAR | Status: DC

## 2020-09-15 MED ORDER — PRENATAL MULTIVITAMIN CH
1.0000 | ORAL_TABLET | Freq: Every day | ORAL | Status: DC
  Administered 2020-09-16 – 2020-09-18 (×3): 1 via ORAL
  Filled 2020-09-15 (×3): qty 1

## 2020-09-15 MED ORDER — DEXTROSE 5 % IV SOLN
INTRAVENOUS | Status: AC
  Filled 2020-09-15: qty 3000

## 2020-09-15 MED ORDER — SIMETHICONE 80 MG PO CHEW
80.0000 mg | CHEWABLE_TABLET | ORAL | Status: DC
  Administered 2020-09-16 – 2020-09-17 (×3): 80 mg via ORAL
  Filled 2020-09-15 (×3): qty 1

## 2020-09-15 MED ORDER — NALOXONE HCL 0.4 MG/ML IJ SOLN: 0.4000 mg | INTRAMUSCULAR | Status: DC | PRN

## 2020-09-15 MED ORDER — FENTANYL CITRATE (PF) 100 MCG/2ML IJ SOLN
INTRAMUSCULAR | Status: AC
Start: 2020-09-15 — End: ?
  Filled 2020-09-15: qty 2

## 2020-09-15 MED ORDER — MENTHOL 3 MG MT LOZG: 1.0000 | LOZENGE | OROMUCOSAL | Status: DC | PRN

## 2020-09-15 MED ORDER — FENTANYL CITRATE (PF) 100 MCG/2ML IJ SOLN
INTRAMUSCULAR | Status: DC | PRN
  Administered 2020-09-15: 15 ug via INTRAVENOUS

## 2020-09-15 MED ORDER — MORPHINE SULFATE (PF) 0.5 MG/ML IJ SOLN
INTRAMUSCULAR | Status: DC | PRN
Start: 2020-09-15 — End: 2020-09-15
  Administered 2020-09-15: .15 mg via EPIDURAL

## 2020-09-15 MED ORDER — OXYTOCIN-SODIUM CHLORIDE 30-0.9 UT/500ML-% IV SOLN
2.5000 [IU]/h | INTRAVENOUS | Status: AC
  Administered 2020-09-15: 2.5 [IU]/h via INTRAVENOUS
  Filled 2020-09-15: qty 500

## 2020-09-15 MED ORDER — SIMETHICONE 80 MG PO CHEW: 80.0000 mg | CHEWABLE_TABLET | ORAL | Status: DC | PRN

## 2020-09-15 MED ORDER — ORAL CARE MOUTH RINSE: 15.0000 mL | Freq: Once | OROMUCOSAL | Status: DC

## 2020-09-15 MED ORDER — NALBUPHINE HCL 10 MG/ML IJ SOLN: 5.0000 mg | Freq: Once | INTRAMUSCULAR | Status: DC | PRN

## 2020-09-15 MED ORDER — PROMETHAZINE HCL 25 MG/ML IJ SOLN: 6.2500 mg | INTRAMUSCULAR | Status: DC | PRN

## 2020-09-15 MED ORDER — ZOLPIDEM TARTRATE 5 MG PO TABS: 5.0000 mg | ORAL_TABLET | Freq: Every evening | ORAL | Status: DC | PRN

## 2020-09-15 MED ORDER — SCOPOLAMINE 1 MG/3DAYS TD PT72
MEDICATED_PATCH | TRANSDERMAL | Status: AC
  Filled 2020-09-15: qty 1

## 2020-09-15 MED ORDER — ONDANSETRON HCL 4 MG/2ML IJ SOLN
INTRAMUSCULAR | Status: AC
  Filled 2020-09-15: qty 2

## 2020-09-15 MED ORDER — SOD CITRATE-CITRIC ACID 500-334 MG/5ML PO SOLN
ORAL | Status: AC
  Filled 2020-09-15: qty 30

## 2020-09-15 MED ORDER — SCOPOLAMINE 1 MG/3DAYS TD PT72
1.0000 | MEDICATED_PATCH | Freq: Once | TRANSDERMAL | Status: DC
  Administered 2020-09-15: 1.5 mg via TRANSDERMAL

## 2020-09-15 SURGICAL SUPPLY — 42 items
ADH SKN CLS APL DERMABOND .7 (GAUZE/BANDAGES/DRESSINGS) ×2
ADH SKN CLS LQ APL DERMABOND (GAUZE/BANDAGES/DRESSINGS) ×2
CLAMP CORD UMBIL (MISCELLANEOUS) IMPLANT
CLOTH BEACON ORANGE TIMEOUT ST (SAFETY) ×3 IMPLANT
DERMABOND ADHESIVE PROPEN (GAUZE/BANDAGES/DRESSINGS) ×4
DERMABOND ADVANCED (GAUZE/BANDAGES/DRESSINGS) ×4
DERMABOND ADVANCED .7 DNX12 (GAUZE/BANDAGES/DRESSINGS) ×2 IMPLANT
DERMABOND ADVANCED .7 DNX6 (GAUZE/BANDAGES/DRESSINGS) ×2 IMPLANT
DRSG OPSITE POSTOP 4X10 (GAUZE/BANDAGES/DRESSINGS) ×3 IMPLANT
ELECT REM PT RETURN 9FT ADLT (ELECTROSURGICAL) ×3
ELECTRODE REM PT RTRN 9FT ADLT (ELECTROSURGICAL) ×1 IMPLANT
EXTRACTOR VACUUM BELL STYLE (SUCTIONS) IMPLANT
GLOVE BIOGEL PI IND STRL 7.0 (GLOVE) ×1 IMPLANT
GLOVE BIOGEL PI IND STRL 8 (GLOVE) ×1 IMPLANT
GLOVE BIOGEL PI INDICATOR 7.0 (GLOVE) ×2
GLOVE BIOGEL PI INDICATOR 8 (GLOVE) ×2
GLOVE ECLIPSE 8.0 STRL XLNG CF (GLOVE) ×3 IMPLANT
GOWN STRL REUS W/TWL LRG LVL3 (GOWN DISPOSABLE) ×6 IMPLANT
KIT ABG SYR 3ML LUER SLIP (SYRINGE) ×3 IMPLANT
NDL HYPO 18GX1.5 BLUNT FILL (NEEDLE) ×1 IMPLANT
NDL HYPO 25X5/8 SAFETYGLIDE (NEEDLE) ×1 IMPLANT
NEEDLE HYPO 18GX1.5 BLUNT FILL (NEEDLE) ×3 IMPLANT
NEEDLE HYPO 22GX1.5 SAFETY (NEEDLE) ×3 IMPLANT
NEEDLE HYPO 25X5/8 SAFETYGLIDE (NEEDLE) ×3 IMPLANT
NS IRRIG 1000ML POUR BTL (IV SOLUTION) ×3 IMPLANT
PACK C SECTION WH (CUSTOM PROCEDURE TRAY) ×3 IMPLANT
PAD OB MATERNITY 4.3X12.25 (PERSONAL CARE ITEMS) ×3 IMPLANT
PENCIL SMOKE EVAC W/HOLSTER (ELECTROSURGICAL) ×3 IMPLANT
RTRCTR C-SECT PINK 25CM LRG (MISCELLANEOUS) IMPLANT
SUT CHROMIC 0 CT 1 (SUTURE) ×3 IMPLANT
SUT MNCRL 0 VIOLET CTX 36 (SUTURE) ×2 IMPLANT
SUT MONOCRYL 0 CTX 36 (SUTURE) ×9
SUT PLAIN 2 0 (SUTURE) ×12
SUT PLAIN 2 0 XLH (SUTURE) IMPLANT
SUT PLAIN ABS 2-0 CT1 27XMFL (SUTURE) ×2 IMPLANT
SUT VIC AB 0 CTX 36 (SUTURE) ×3
SUT VIC AB 0 CTX36XBRD ANBCTRL (SUTURE) ×1 IMPLANT
SUT VIC AB 4-0 KS 27 (SUTURE) IMPLANT
SYR 20CC LL (SYRINGE) ×6 IMPLANT
TOWEL OR 17X24 6PK STRL BLUE (TOWEL DISPOSABLE) ×3 IMPLANT
TRAY FOLEY W/BAG SLVR 14FR LF (SET/KITS/TRAYS/PACK) IMPLANT
WATER STERILE IRR 1000ML POUR (IV SOLUTION) ×3 IMPLANT

## 2020-09-15 NOTE — Anesthesia Procedure Notes (Signed)
Spinal  Patient location during procedure: OR Start time: 09/15/2020 5:40 PM End time: 09/15/2020 5:46 PM Staffing Performed: anesthesiologist  Anesthesiologist: Mellody Dance, MD Preanesthetic Checklist Completed: patient identified, IV checked, risks and benefits discussed, surgical consent, monitors and equipment checked, pre-op evaluation and timeout performed Spinal Block Patient position: sitting Prep: DuraPrep and site prepped and draped Patient monitoring: continuous pulse ox, blood pressure and heart rate Approach: midline Location: L3-4 Injection technique: single-shot Needle Needle type: Pencan  Needle gauge: 24 G Needle length: 9 cm Additional Notes Functioning IV was confirmed and monitors were applied. Sterile prep and drape, including hand hygiene and sterile gloves were used. The patient was positioned and the spine was prepped. The skin was anesthetized with lidocaine.  Free flow of clear CSF was obtained prior to injecting local anesthetic into the CSF. The needle was carefully withdrawn. The patient tolerated the procedure well.

## 2020-09-15 NOTE — Anesthesia Postprocedure Evaluation (Signed)
Anesthesia Post Note  Patient: FREDI HURTADO  Procedure(s) Performed: CESAREAN SECTION WITH BILATERAL TUBAL LIGATION (N/A )     Patient location during evaluation: Mother Baby Anesthesia Type: Spinal Level of consciousness: oriented and awake and alert Pain management: pain level controlled Vital Signs Assessment: post-procedure vital signs reviewed and stable Respiratory status: spontaneous breathing and respiratory function stable Cardiovascular status: blood pressure returned to baseline and stable Postop Assessment: no headache, no backache, no apparent nausea or vomiting and able to ambulate Anesthetic complications: no   No complications documented.  Last Vitals:  Vitals:   09/15/20 2030 09/15/20 2035  BP: 109/73   Pulse: 78 83  Resp: 14 20  Temp:    SpO2: 100% 99%    Last Pain:  Vitals:   09/15/20 2035  TempSrc:   PainSc: 6    Pain Goal:                Epidural/Spinal Function Cutaneous sensation: Tingles (09/15/20 2025), Patient able to flex knees: Yes (09/15/20 2025), Patient able to lift hips off bed: Yes (09/15/20 2025), Back pain beyond tenderness at insertion site: No (09/15/20 2025), Progressively worsening motor and/or sensory loss: No (09/15/20 2025), Bowel and/or bladder incontinence post epidural: No (09/15/20 2025)  Trevor Iha

## 2020-09-15 NOTE — Discharge Summary (Addendum)
Postpartum Discharge Summary     Patient Name: Erika [name] DOB: 1986-03-07 MRN: [id number]  Date of admission: 09/15/2020 Delivery date:09/15/2020  Delivering provider: Florian Buff  Date of discharge: 09/18/2020  Admitting diagnosis: S/P cesarean section [Z98.891] Intrauterine pregnancy: [redacted]w[redacted]d    Secondary diagnosis:  Principal Problem:   Cesarean delivery delivered Active Problems:   History of C-section   Hypothyroidism   S/P cesarean section   History of bilateral tubal ligation  Additional problems: as noted above    Discharge diagnosis: Cesarean delivery delivered                                        Post partum procedures:postpartum tubal ligation Augmentation: N/A Complications: none  Hospital course: Sceduled C/S   34y.o. yo G2P2002 at 444w1das admitted to the hospital 09/15/2020 for scheduled cesarean section with the following indication:Elective Repeat.Delivery details are as follows:  Membrane Rupture Time/Date: 6:23 PM ,09/15/2020   Delivery Method:C-Section, Low Transverse  Details of operation can be found in separate operative note.  Patient had an uncomplicated postpartum course.  She is ambulating, tolerating a regular diet, passing flatus, and urinating well. Patient is discharged home in stable condition on  09/18/20        Newborn Data: Birth date:09/15/2020  Birth time:6:25 PM  Gender:Female  Living status:Living  Apgars:1 ,8  Weight:3450 g     Magnesium Sulfate received: No BMZ received: No Rhophylac:N/A MMR:N/A T-DaP:Given prenatally Flu: No Transfusion:No  Physical exam  Vitals:   09/17/20 0500 09/17/20 1400 09/17/20 2059 09/18/20 0533  BP: 101/62 104/62 120/69 94/66  Pulse: 96 95 84 84  Resp: _0 Temp: 98.8 F (37.1 C) 98.5 F (36.9 C) 98.5 F (36.9 C) 98 F (36.7 C)  TempSrc: Oral Oral Oral Oral  SpO2: 99% 99% 99% 99%  Weight:      Height:       General: alert, cooperative and no distress Lochia:  appropriate Uterine Fundus: firm Incision: No significant erythema, Dressing is clean, dry, and intact DVT Evaluation: No evidence of DVT seen on physical exam. Labs: Lab Results  Component Value Date   WBC 9.7 09/16/2020   HGB 10.6 (L) 09/16/2020   HCT 32.3 (L) 09/16/2020   MCV 89.7 09/16/2020   PLT 229 09/16/2020   CMP Latest Ref Rng & Units 09/15/2020  Glucose 70 - 99 mg/dL 105(H)  BUN 6 - 20 mg/dL 5(L)  Creatinine 0.44 - 1.00 mg/dL 0.58  Sodium 135 - 145 mmol/L 137  Potassium 3.5 - 5.1 mmol/L 4.2  Chloride 98 - 111 mmol/L 107  CO2 22 - 32 mmol/L 18(L)  Calcium 8.9 - 10.3 mg/dL 9.0  Total Protein 6.5 - 8.1 g/dL 5.4(L)  Total Bilirubin 0.3 - 1.2 mg/dL 0.8  Alkaline Phos 38 - 126 U/L 93  AST 15 - 41 U/L 19  ALT 0 - 44 U/L 18   Edinburgh Score: Edinburgh Postnatal Depression Scale Screening Tool 09/17/2020  I have been able to laugh and see the funny side of things. 0  I have looked forward with enjoyment to things. 0  I have blamed myself unnecessarily when things went wrong. 1  I have been anxious or worried for no good reason. 0  I have felt scared or panicky for no good reason. 0  Things have been getting on top of me. 1  I have been so unhappy that I have had difficulty sleeping. 0  I have felt sad or miserable. 0  I have been so unhappy that I have been crying. 0  The thought of harming myself has occurred to me. 0  Edinburgh Postnatal Depression Scale Total 2     After visit meds:  Allergies as of 09/18/2020      Reactions   Bee Venom Swelling   Doxycycline Nausea And Vomiting      Medication List    STOP taking these medications   Blood Pressure Monitor Misc     TAKE these medications   Claritin 10 MG tablet Generic drug: loratadine Take 10 mg by mouth daily.   levothyroxine 200 MCG tablet Commonly known as: SYNTHROID Take 200 mcg by mouth See admin instructions. Taking on Mon, Wed, Friday, alternating with 175 mcg on other days.    Levothyroxine Sodium 175 MCG Caps Take 175 mcg by mouth See admin instructions. Taking on Tues, Thur, Saturday and Sunday. Alternating with 200 mcg on other days.   multivitamin-prenatal 27-0.8 MG Tabs tablet Take 1 tablet by mouth daily at 12 noon.   oxyCODONE 5 MG immediate release tablet Commonly known as: Oxy IR/ROXICODONE Take 1-2 tablets (5-10 mg total) by mouth every 4 (four) hours as needed for severe pain.      Discharge home in stable condition Infant Feeding: Breast Infant Disposition:home with mother Discharge instruction: per After Visit Summary and Postpartum booklet. Activity: Advance as tolerated. Pelvic rest for 6 weeks.  Diet: routine diet Future Appointments:No future appointments. Follow up Visit:  Follow-up Information    [facility] Follow up.   Specialty: Obstetrics and Gynecology Why: IN 1 week for an incision check and 4 week for a postpartum appointment Contact information: [address] [place] [phone]             Message sent to [facility] on 09/15/20 to schedule PP appt and other f/u appts  Please schedule this patient for a In person postpartum visit in 4 weeks with the following provider: MD. Additional Postpartum F/U:Incision check 1 week and repeat TFTs in 6-8 weeks  High risk pregnancy complicated by: h/o Cesarean x1, hypothyroidism on synthroid Delivery mode:  C-Section, Low Transverse  Anticipated Birth Control:  BTL done PP   09/18/2020 Wende Mott, CNM

## 2020-09-15 NOTE — H&P (Signed)
OBSTETRIC ADMISSION HISTORY AND PHYSICAL  Erika [name] is a 34 y.o. female G2P1001 with IUP at [redacted]w[redacted]d by LMP presenting for scheduled elective repeat Cesarean and BTL (consent form signed 07/10/20). She reports +FMs, No LOF, no VB, no blurry vision, headaches or peripheral edema, and RUQ pain.  She plans on breast feeding. She requests BTL for birth control as noted above.  She received her prenatal care at [facility]   Dating: By LMP --->  Estimated Date of Delivery: 09/14/20  Sono:  @[redacted]w[redacted]d , CWD, normal anatomy, cephalic presentation, 3450g, EFW  Prenatal History/Complications:  -hypothyroidism (synthroid 31% daily) -husband's daughter with Turner's syndrome  Past Medical History: Past Medical History:  Diagnosis Date  . Asthma   . Infections of kidney   . Infections of kidney   . Kidney infection   . Thyroid disease     Past Surgical History: Past Surgical History:  Procedure Laterality Date  . CESAREAN SECTION    . URETHRAL DILATION      Obstetrical History: OB History    Gravida  2   Para  1   Term  1   Preterm      AB      Living  1     SAB      TAB      Ectopic      Multiple      Live Births  1           Social History Social History   Socioeconomic History  . Marital status: Married    Spouse name: Shakeyla Giebler  . Number of children: Not on file  . Years of education: Not on file  . Highest education level: Not on file  Occupational History  . Not on file  Tobacco Use  . Smoking status: Former Smoker    Packs/day: 1.00    Types: Cigarettes  . Smokeless tobacco: Never Used  Vaping Use  . Vaping Use: Never used  Substance and Sexual Activity  . Alcohol use: No  . Drug use: No  . Sexual activity: Yes    Birth control/protection: None  Other Topics Concern  . Not on file  Social History Narrative  . Not on file   Social Determinants of Health   Financial Resource Strain: Low Risk   . Difficulty of Paying Living  Expenses: Not hard at all  Food Insecurity: No Food Insecurity  . Worried About Flonnie Overman in the Last Year: Never true  . Ran Out of Food in the Last Year: Never true  Transportation Needs: No Transportation Needs  . Lack of Transportation (Medical): No  . Lack of Transportation (Non-Medical): No  Physical Activity: Sufficiently Active  . Days of Exercise per Week: 4 days  . Minutes of Exercise per Session: 40 min  Stress: No Stress Concern Present  . Feeling of Stress : Not at all  Social Connections: Moderately Isolated  . Frequency of Communication with Friends and Family: More than three times a week  . Frequency of Social Gatherings with Friends and Family: Once a week  . Attends Religious Services: Never  . Active Member of Clubs or Organizations: No  . Attends Programme researcher, broadcasting/film/video Meetings: Never  . Marital Status: Married    Family History: Family History  Problem Relation Age of Onset  . Thyroid disease Father     Allergies: Allergies  Allergen Reactions  . Bee Venom Swelling  . Doxycycline Nausea And Vomiting  Medications Prior to Admission  Medication Sig Dispense Refill Last Dose  . levothyroxine (SYNTHROID) 200 MCG tablet Take 200 mcg by mouth daily before breakfast.   09/15/2020 at Unknown time  . levothyroxine (SYNTHROID, LEVOTHROID) 100 MCG tablet Take 175 mcg by mouth daily.    09/14/2020 at Unknown time  . Loratadine (CLARITIN PO) Take by mouth daily.   09/15/2020 at Unknown time  . Prenatal Vit-Fe Fumarate-FA (MULTIVITAMIN-PRENATAL) 27-0.8 MG TABS tablet Take 1 tablet by mouth daily at 12 noon.   09/15/2020 at Unknown time  . Blood Pressure Monitor MISC For regular home bp monitoring during pregnancy 1 each 0      Review of Systems   All systems reviewed and negative except as stated in HPI  Blood pressure 136/73, pulse 94, temperature 98.4 F (36.9 C), temperature source Oral, resp. rate 18, height 5\' 4"  (1.626 m), weight (!) 142 kg,  last menstrual period 12/09/2019, SpO2 100 %. General appearance: alert, cooperative and appears stated age Lungs: clear to auscultation bilaterally Heart: regular rate and rhythm Abdomen: soft, non-tender; bowel sounds normal Extremities: no sign of DVT Presentation: cephalic Fetal monitoring: FHT 177 with doppler    Prenatal labs: ABO, Rh: --/--/O POS (10/15 1450) Antibody: NEG (10/15 1450) Rubella: 1.47 (04/05 1104) RPR: Non Reactive (07/14 0916)  HBsAg: Negative (04/05 1104)  HIV: NON REACTIVE (10/15 1439)  GBS: Positive/-- (09/16 1645)  2 hr Glucola: 75/110/97  Genetic screening wnl Anatomy 04-24-1975 wnl  Prenatal Transfer Tool  Maternal Diabetes: No Genetic Screening: Normal Maternal Ultrasounds/Referrals: Normal Fetal Ultrasounds or other Referrals:  None Maternal Substance Abuse:  No Significant Maternal Medications:  Synthroid [place] daily Significant Maternal Lab Results: Group B Strep positive  Results for orders placed or performed during the hospital encounter of 09/15/20 (from the past 24 hour(s))  Respiratory Panel by RT PCR (Flu A&B, Covid) - Nasopharyngeal Swab   Collection Time: 09/15/20  2:39 PM   Specimen: Nasopharyngeal Swab  Result Value Ref Range   SARS Coronavirus 2 by RT PCR NEGATIVE NEGATIVE   Influenza A by PCR NEGATIVE NEGATIVE   Influenza B by PCR NEGATIVE NEGATIVE  CBC   Collection Time: 09/15/20  2:39 PM  Result Value Ref Range   WBC 11.1 (H) 4.0 - 10.5 K/uL   RBC 4.27 3.87 - 5.11 MIL/uL   Hemoglobin 12.3 12.0 - 15.0 g/dL   HCT 09/17/20 36 - 46 %   MCV 89.0 80.0 - 100.0 fL   MCH 28.8 26.0 - 34.0 pg   MCHC 32.4 30.0 - 36.0 g/dL   RDW 78.9 38.1 - 01.7 %   Platelets 274 150 - 400 K/uL   nRBC 0.0 0.0 - 0.2 %  Comprehensive metabolic panel   Collection Time: 09/15/20  2:39 PM  Result Value Ref Range   Sodium 137 135 - 145 mmol/L   Potassium 4.2 3.5 - 5.1 mmol/L   Chloride 107 98 - 111 mmol/L   CO2 18 (L) 22 - 32 mmol/L   Glucose, Bld 105 (H)  70 - 99 mg/dL   BUN 5 (L) 6 - 20 mg/dL   Creatinine, Ser 09/17/20 0.44 - 1.00 mg/dL   Calcium 9.0 8.9 - 2.58 mg/dL   Total Protein 5.4 (L) 6.5 - 8.1 g/dL   Albumin 2.7 (L) 3.5 - 5.0 g/dL   AST 19 15 - 41 U/L   ALT 18 0 - 44 U/L   Alkaline Phosphatase 93 38 - 126 U/L   Total Bilirubin 0.8 0.3 -  1.2 mg/dL   GFR, Estimated >24 >23 mL/min   Anion gap 12 5 - 15  Rapid HIV screen (HIV 1/2 Ab+Ag)   Collection Time: 09/15/20  2:39 PM  Result Value Ref Range   HIV-1 P24 Antigen - HIV24 NON REACTIVE NON REACTIVE   HIV 1/2 Antibodies NON REACTIVE NON REACTIVE   Interpretation (HIV Ag Ab)      A non reactive test result means that HIV 1 or HIV 2 antibodies and HIV 1 p24 antigen were not detected in the specimen.  Type and screen   Collection Time: 09/15/20  2:50 PM  Result Value Ref Range   ABO/RH(D) O POS    Antibody Screen NEG    Sample Expiration      09/18/2020,2359 Performed at [facility], [address], [place], [place] [address]     Patient Active Problem List   Diagnosis Date Noted  . Request for sterilization 08/02/2020  . Body mass index (BMI) 45.0-49.9, adult (HCC) 03/06/2020  . Encounter for supervision of other normal pregnancy, unspecified trimester 03/02/2020  . History of C-section 01/13/2020  . Hypothyroidism 01/13/2020  . Infections of kidney     Assessment/Plan:  JERILEE SPACE is a 34 y.o. G2P1001 at [redacted]w[redacted]d here for scheduled repeat Cesarean and postpartum BTL.  #Scheduled Elective Repeat Cesarean + Post-partum BTL: Pt with history of Cesarean x1 for failure to progress at 8-9cm.  The risks of cesarean section were discussed with the patient including but were not limited to: bleeding which may require transfusion or reoperation; infection which may require antibiotics; injury to bowel, bladder, ureters or other surrounding organs; injury to the fetus; need for additional procedures including hysterectomy in the event of a life-threatening hemorrhage; placental  abnormalities wth subsequent pregnancies, incisional problems, thromboembolic phenomenon and other postoperative/anesthesia complications.  Patient also desires permanent sterilization.  Other reversible forms of contraception were discussed with patient; she declines all other modalities. Risks of procedure discussed with patient including but not limited to: risk of regret, permanence of method, bleeding, infection, injury to surrounding organs and need for additional procedures.  Failure risk of about 1% with increased risk of ectopic gestation if pregnancy occurs was also discussed with patient.  Also discussed possibility of post-tubal pain syndrome. The patient concurred with the proposed plan, giving informed written consent for the procedures.  Patient has been NPO since 0100; she will remain NPO for procedure. Anesthesia and OR aware.  Preoperative prophylactic antibiotics and SCDs ordered on call to the OR.  To OR when ready.  #Pain: Per anesthesia #FWB:  FHT 177 with doppler #ID: GBS+, plan for ancef 3g prior to OR #MOF: breast #MOC: plan for PP BTL as noted above. Consent form signed in clinic on 07/10/20. #Circ: n/a #Hypothyroidism: continued on home synthroid daily. Will need repeat TFTs 6-8 weeks postpartum  Sheila Oats, MD  09/15/2020, 4:51 PM

## 2020-09-15 NOTE — Anesthesia Preprocedure Evaluation (Addendum)
Anesthesia Evaluation  Patient identified by MRN, date of birth, ID band Patient awake    Reviewed: NPO status , Patient's Chart, lab work & pertinent test results  Airway Mallampati: II  TM Distance: >3 FB Neck ROM: Full    Dental no notable dental hx.    Pulmonary asthma , former smoker,    Pulmonary exam normal breath sounds clear to auscultation       Cardiovascular Exercise Tolerance: Good METS: 3 - Mets negative cardio ROS Normal cardiovascular exam Rhythm:Regular Rate:Normal     Neuro/Psych negative neurological ROS  negative psych ROS   GI/Hepatic negative GI ROS, Neg liver ROS,   Endo/Other  Hypothyroidism Morbid obesity  Renal/GU      Musculoskeletal negative musculoskeletal ROS (+)   Abdominal   Peds negative pediatric ROS (+)  Hematology negative hematology ROS (+)   Anesthesia Other Findings   Reproductive/Obstetrics (+) Pregnancy                            Anesthesia Physical Anesthesia Plan  ASA: IV  Anesthesia Plan: Spinal   Post-op Pain Management:    Induction:   PONV Risk Score and Plan: 2 and Treatment may vary due to age or medical condition  Airway Management Planned: Natural Airway  Additional Equipment:   Intra-op Plan:   Post-operative Plan:   Informed Consent: I have reviewed the patients History and Physical, chart, labs and discussed the procedure including the risks, benefits and alternatives for the proposed anesthesia with the patient or authorized representative who has indicated his/her understanding and acceptance.       Plan Discussed with: Anesthesiologist  Anesthesia Plan Comments: (Lumbar landmarks seem reasonable for a spinal. GETA as backup plan. Last C-section was in [place] 5 years ago and went smoothly per patient.)       Anesthesia Quick Evaluation

## 2020-09-15 NOTE — Op Note (Signed)
Preoperative diagnosis:  1.  Intrauterine pregnancy at [redacted] weeks gestation                                         2.  Previous C section declines TOL                                         3.  Desires permanent sterilization    Postoperative diagnosis:  Same as above plus dense adhesions of bladder lower uterine segment and abdominal wall  Procedure:  Repeat cesarean section and bilateral fimbriectomy for sterilization  Surgeon:  Lazaro Arms MD  Assistant:  Lynnda Shields MD  Anesthesia: Spinal  Findings:  Unfortunately the patient had dense adhesions of the abdominal wall to the bladder lower uterine segment complex whci only allowed uterine entry nearly to the level of the ovaries and limited the window available for delivery of the baby. In addition the placenta was anterior and had to be removed to access the baby   Over a low transverse incision was delivered a viable female with Apgars of 1 and 8 weighing 7 lbs. 8 oz. Uterus, tubes and ovaries were all normal.  There were no other significant findings  Description of operation:  Patient was taken to the operating room and placed in the sitting position where she underwent a spinal anesthetic. She was then placed in the supine position with tilt to the left side. When adequate anesthetic level was obtained she was prepped and draped in usual sterile fashion and a Foley catheter was placed. A Pfannenstiel skin incision was made and carried down sharply to the rectus fascia which was scored in the midline extended laterally. The fascia was taken off the muscles both superiorly and without difficulty. The muscles were divided.  It was obvious qt this point of the adhesions issues.The peritoneal cavity was entered very high.  Only a small uterine window could safely be made. Bladder blade was placed, no bladder flap was created. A manual defect was made in the uterus and extended at an angle lower left to upper right of the uterus.  It was the  most room I could make which was still limited. The placenta was anterior and had to be delivered to access the baby.  The baby could not be delivered vertex due to the window and abdominal wall did not give at all.  So I converted the baby to breech and did a breech extraction and delivered a viable female  infant at 45 with Apgars of 1 and 8 weighing7 lbs 8 oz.  Cord pH was obtained and was 7.04/pCO2 81. The uterus was exteriorized. It was closed in multiple layers ofr hemostasis and anatomical reconstruction.  layers, the first being a running interlocking layer and the second being an imbricating layer using 0 monocryl on a CTX needle. There was good resulting hemostasis. A bilateral fimbriectomy was performed as a sterilization technique, it was the only part of the tube that was accessible.  I informed patient of that in real time.  Peritoneal cavity was irrigated vigorously. The muscles and peritoneum were reapproximated loosely. The fascia was closed using 0 Vicryl in running fashion. Subcutaneous tissue was made hemostatic and irrigated. The skin was closed using 4-0 Vicryl on a Keith needle in  a subcuticular fashion.  Dermabond was placed for additional wound integrity and to serve as a barrier. Blood loss for the procedure was 1032 cc. The patient received Ancef prophylactically. The patient was taken to the recovery room in good stable condition with all counts being correct x3.  EBL 1032 cc  Lazaro Arms 09/15/2020 8:04 PM

## 2020-09-15 NOTE — Transfer of Care (Signed)
Immediate Anesthesia Transfer of Care Note  Patient: Erika [name]  Procedure(s) Performed: CESAREAN SECTION WITH BILATERAL TUBAL LIGATION (N/A )  Patient Location: PACU  Anesthesia Type:Spinal  Level of Consciousness: awake, alert  and oriented  Airway & Oxygen Therapy: Patient Spontanous Breathing  Post-op Assessment: Report given to RN and Post -op Vital signs reviewed and stable  Post vital signs: Reviewed and stable BP 112/73  Last Vitals:  Vitals Value Taken Time  BP 94/82 09/15/20 1946  Temp 36.8 C 09/15/20 1940  Pulse 84 09/15/20 1949  Resp 19 09/15/20 1949  SpO2 100 % 09/15/20 1949  Vitals shown include unvalidated device data.  Last Pain:  Vitals:   09/15/20 1940  TempSrc: Oral         Complications: No complications documented.

## 2020-09-16 ENCOUNTER — Encounter (HOSPITAL_COMMUNITY): Payer: Self-pay | Admitting: Obstetrics & Gynecology

## 2020-09-16 LAB — CBC
HCT: 32.3 % — ABNORMAL LOW (ref 36.0–46.0)
Hemoglobin: 10.6 g/dL — ABNORMAL LOW (ref 12.0–15.0)
MCH: 29.4 pg (ref 26.0–34.0)
MCHC: 32.8 g/dL (ref 30.0–36.0)
MCV: 89.7 fL (ref 80.0–100.0)
Platelets: 229 10*3/uL (ref 150–400)
RBC: 3.6 MIL/uL — ABNORMAL LOW (ref 3.87–5.11)
RDW: 14.3 % (ref 11.5–15.5)
WBC: 9.7 10*3/uL (ref 4.0–10.5)
nRBC: 0 % (ref 0.0–0.2)

## 2020-09-16 MED ORDER — OXYCODONE HCL 5 MG PO TABS
5.0000 mg | ORAL_TABLET | ORAL | Status: DC | PRN
  Administered 2020-09-16 – 2020-09-18 (×6): 10 mg via ORAL
  Filled 2020-09-16 (×6): qty 2

## 2020-09-16 NOTE — Progress Notes (Signed)
POSTPARTUM PROGRESS NOTE  Subjective: Erika [name] is a 34 y.o. [id number] s/p rLTCS and BTL at [redacted]w[redacted]d.  She reports she doing well. No acute events overnight. She still has foley catheter in place. Has not yet ambulated but did stand and tolerated well. Denies nausea or vomiting. She has not passed flatus. Pain is well controlled.  Lochia is minimal. Baby is doing well!   Objective: Blood pressure (!) 121/56, pulse 82, temperature 98.3 F (36.8 C), temperature source Oral, resp. rate 18, height 5\' 4"  (1.626 m), weight (!) 142 kg, last menstrual period 12/09/2019, SpO2 97 %, unknown if currently breastfeeding.  Physical Exam:  General: alert, cooperative and no distress Chest: no respiratory distress Abdomen: soft, non-tender// dressing c/d/i  Uterine Fundus: firm and at level of umbilicus Extremities: No calf swelling or tenderness   trace pedal edema  Recent Labs    09/15/20 1439 09/16/20 0420  HGB 12.3 10.6*  HCT 38.0 32.3*    Assessment/Plan: Erika [name] is a 34 y.o. 20 s/p repeat LTCS and BTL at [redacted]w[redacted]d. Dense adhesions in c section. POD#1  Routine Postpartum Care: Doing well, pain well-controlled overall. POD#1.  -- Continue routine care, lactation support. -- Hgb 12.3 > 10.6  -- Contraception: s/p BTL -- Feeding: breast   -Hypothyroidism - continue synthroid [redacted]w[redacted]d daily    Dispo: Plan for discharge POD#2 or 3.  , MD OB Fellow, [facility] 09/16/2020 6:45 AM

## 2020-09-16 NOTE — Progress Notes (Signed)
Patient asked for formula because she did not produce enough milk for her other children and had to do breast and bottle. Donor milk was offered,but patient insisted on the formula. Patient stated she will continue to breastfeed.

## 2020-09-17 MED ORDER — OXYCODONE HCL 5 MG PO TABS: 5.0000 mg | ORAL_TABLET | ORAL | 0 refills | Status: DC | PRN

## 2020-09-17 NOTE — Progress Notes (Addendum)
Post Op Day 2  Subjective Up ad lib, voiding, tolerating PO, and + flatus. Denies dizziness, lightheadedness, tachycardia. Appropriate Lochia. Pain well controlled.  Objective Temp:  [98.3 F (36.8 C)-98.8 F (37.1 C)] 98.8 F (37.1 C) (10/17 0500) Pulse Rate:  [78-96] 96 (10/17 0500) Resp:  [17-18] 18 (10/17 0500) BP: (101-135)/(53-71) 101/62 (10/17 0500) SpO2:  [98 %-99 %] 99 % (10/17 0500) Intake/Output      10/16 0701 - 10/17 0700 10/17 0701 - 10/18 0700   I.V. (mL/kg)     Total Intake(mL/kg)     Urine (mL/kg/hr) 500 (0.1)    Blood     Total Output 500    Net -500           General: alert, cooperative, NAD Lochia: appropriate Uterine Fundus: firm Incision: no significant drainage, no dehiscence, no significant erythema DVT Evaluation: No evidence of DVT, extremities warm and well perfused.   Recent Labs    09/15/20 1439 09/16/20 0420  HGB 12.3 10.6*  HCT 38.0 32.3*   Assessment & Erika [name] Post Op Day # 2 Patient initially wanting to DC today. Now, Erika [name] for dc tomorrow - oxycodone has already been sent to pharmacy  Keep incision site clean and dry, OOB  Pain: scheduled ibuprofen. PRN tylenol & oxycodone Breastfeeding, continue daily lactation consult Contraception: s/p PP BTL  Hypothyroidism  Continue synthroid & 175 mcg synthroid every other day (continued prenatal dose through her pregnancy)    LOS: 2 days   Erika Erika [name] 09/17/2020, 8:06 AM   Attestation of Supervision of Student:  I confirm that I have verified the information documented in the  resident's  note and that I have also personally reperformed the history, physical exam and all medical decision making activities.  I have verified that all services and findings are accurately documented in this student's note; and I agree with management and Erika [name] as outlined in the documentation. I have also made any necessary editorial changes.  Erika Oats, MD [facility], [facility] 09/17/2020 10:09 PM

## 2020-09-18 LAB — RPR: RPR Ser Ql: NONREACTIVE

## 2020-09-18 NOTE — Lactation Note (Signed)
This note was copied from a baby's chart. Lactation Consultation Note  Patient Name: Erika [name] Today's Date: 09/18/2020   Initial visit at 63 hours of life. Mom is a P2 who nursed her 1st child for 4 weeks, but switched to formula b/c of not having enough milk. Mom is offering breast, then bottle. Mom reports that infant latches "really, really good." Mom also reports that infant does not have difficulty transitioning between the breast & bottle.   Mom has a [employer] pump at home. To increase Mom's potential supply, I suggested that Mom pump when infant receives formula. She was in agreement. The hand-out from the CDC, "How to Keep Your Breast Pump Kit Clean," was provided to Mom with an emphasis on not washing pump parts in sink.   Infant has gained 54 g over the last 24 hrs.    [name], Erika Hamilton 09/18/2020, 9:48 AM    

## 2020-09-19 LAB — SURGICAL PATHOLOGY

## 2020-09-20 ENCOUNTER — Ambulatory Visit (INDEPENDENT_AMBULATORY_CARE_PROVIDER_SITE_OTHER): Payer: Managed Care, Other (non HMO) | Admitting: Obstetrics and Gynecology

## 2020-09-20 ENCOUNTER — Encounter: Payer: Self-pay | Admitting: Obstetrics and Gynecology

## 2020-09-20 VITALS — BP 118/79 | HR 88 | Ht 64.0 in | Wt 312.2 lb

## 2020-09-20 DIAGNOSIS — Z98891 History of uterine scar from previous surgery: Secondary | ICD-10-CM

## 2020-09-20 NOTE — Progress Notes (Signed)
Erika [name] presents for c section incision check S/P RLTCS and BTL 09/15/20 Unremarkable Post op course Pt reports pain controlled. Ambulating and voiding without problems. Tolerating diet  PE AF VSS Lungs clear Heart RRR Abd soft + BS honeycomb dressing in place, pt declined to have removed in office, appears to be healing well  A/P S/P C section, incision check Doing well. Instructed to remove dressing in shower today or tomorrow. Increase ADL's as tolerates. F/U with PP visit in 3-4 weeks

## 2020-09-20 NOTE — Patient Instructions (Signed)
Cesarean Delivery, Care After This sheet gives you information about how to care for yourself after your procedure. Your health care provider may also give you more specific instructions. If you have problems or questions, contact your health care provider. What can I expect after the procedure? After the procedure, it is common to have:  A small amount of blood or clear fluid coming from the incision.  Some redness, swelling, and pain in your incision area.  Some abdominal pain and soreness.  Vaginal bleeding (lochia). Even though you did not have a vaginal delivery, you will still have vaginal bleeding and discharge.  Pelvic cramps.  Fatigue. You may have pain, swelling, and discomfort in the tissue between your vagina and your anus (perineum) if:  Your C-section was unplanned, and you were allowed to labor and push.  An incision was made in the area (episiotomy) or the tissue tore during attempted vaginal delivery. Follow these instructions at home: Incision care   Follow instructions from your health care provider about how to take care of your incision. Make sure you: ? Wash your hands with soap and water before you change your bandage (dressing). If soap and water are not available, use hand sanitizer. ? If you have a dressing, change it or remove it as told by your health care provider. ? Leave stitches (sutures), skin staples, skin glue, or adhesive strips in place. These skin closures may need to stay in place for 2 weeks or longer. If adhesive strip edges start to loosen and curl up, you may trim the loose edges. Do not remove adhesive strips completely unless your health care provider tells you to do that.  Check your incision area every day for signs of infection. Check for: ? More redness, swelling, or pain. ? More fluid or blood. ? Warmth. ? Pus or a bad smell.  Do not take baths, swim, or use a hot tub until your health care provider says it's okay. Ask your health  care provider if you can take showers.  When you cough or sneeze, hug a pillow. This helps with pain and decreases the chance of your incision opening up (dehiscing). Do this until your incision heals. Medicines  Take over-the-counter and prescription medicines only as told by your health care provider.  If you were prescribed an antibiotic medicine, take it as told by your health care provider. Do not stop taking the antibiotic even if you start to feel better.  Do not drive or use heavy machinery while taking prescription pain medicine. Lifestyle  Do not drink alcohol. This is especially important if you are breastfeeding or taking pain medicine.  Do not use any products that contain nicotine or tobacco, such as cigarettes, e-cigarettes, and chewing tobacco. If you need help quitting, ask your health care provider. Eating and drinking  Drink at least 8 eight-ounce glasses of water every day unless told not to by your health care provider. If you breastfeed, you may need to drink even more water.  Eat high-fiber foods every day. These foods may help prevent or relieve constipation. High-fiber foods include: ? Whole grain cereals and breads. ? Brown rice. ? Beans. ? Fresh fruits and vegetables. Activity   If possible, have someone help you care for your baby and help with household activities for at least a few days after you leave the hospital.  Return to your normal activities as told by your health care provider. Ask your health care provider what activities are safe for   you.  Rest as much as possible. Try to rest or take a nap while your baby is sleeping.  Do not lift anything that is heavier than 10 lbs (4.5 kg), or the limit that you were told, until your health care provider says that it is safe.  Talk with your health care provider about when you can engage in sexual activity. This may depend on your: ? Risk of infection. ? How fast you heal. ? Comfort and desire to  engage in sexual activity. General instructions  Do not use tampons or douches until your health care provider approves.  Wear loose, comfortable clothing and a supportive and well-fitting bra.  Keep your perineum clean and dry. Wipe from front to back when you use the toilet.  If you pass a blood clot, save it and call your health care provider to discuss. Do not flush blood clots down the toilet before you get instructions from your health care provider.  Keep all follow-up visits for you and your baby as told by your health care provider. This is important. Contact a health care provider if:  You have: ? A fever. ? Bad-smelling vaginal discharge. ? Pus or a bad smell coming from your incision. ? Difficulty or pain when urinating. ? A sudden increase or decrease in the frequency of your bowel movements. ? More redness, swelling, or pain around your incision. ? More fluid or blood coming from your incision. ? A rash. ? Nausea. ? Little or no interest in activities you used to enjoy. ? Questions about caring for yourself or your baby.  Your incision feels warm to the touch.  Your breasts turn red or become painful or hard.  You feel unusually sad or worried.  You vomit.  You pass a blood clot from your vagina.  You urinate more than usual.  You are dizzy or light-headed. Get help right away if:  You have: ? Pain that does not go away or get better with medicine. ? Chest pain. ? Difficulty breathing. ? Blurred vision or spots in your vision. ? Thoughts about hurting yourself or your baby. ? New pain in your abdomen or in one of your legs. ? A severe headache.  You faint.  You bleed from your vagina so much that you fill more than one sanitary pad in one hour. Bleeding should not be heavier than your heaviest period. Summary  After the procedure, it is common to have pain at your incision site, abdominal cramping, and slight bleeding from your vagina.  Check  your incision area every day for signs of infection.  Tell your health care provider about any unusual symptoms.  Keep all follow-up visits for you and your baby as told by your health care provider. This information is not intended to replace advice given to you by your health care provider. Make sure you discuss any questions you have with your health care provider. Document Revised: 05/27/2018 Document Reviewed: 05/27/2018 Elsevier Patient Education  2020 [employer]vier Inc.  

## 2020-10-19 ENCOUNTER — Other Ambulatory Visit: Payer: Self-pay

## 2020-10-19 ENCOUNTER — Other Ambulatory Visit (HOSPITAL_COMMUNITY)
Admission: RE | Admit: 2020-10-19 | Discharge: 2020-10-19 | Disposition: A | Discharge status: Home / Self Care | Payer: Managed Care, Other (non HMO) | Source: Ambulatory Visit | Attending: Advanced Practice Midwife | Admitting: Advanced Practice Midwife

## 2020-10-19 ENCOUNTER — Ambulatory Visit (INDEPENDENT_AMBULATORY_CARE_PROVIDER_SITE_OTHER): Payer: Managed Care, Other (non HMO) | Admitting: Advanced Practice Midwife

## 2020-10-19 VITALS — BP 109/77 | HR 98 | Ht 64.0 in | Wt 289.0 lb

## 2020-10-19 DIAGNOSIS — Z124 Encounter for screening for malignant neoplasm of cervix: Secondary | ICD-10-CM | POA: Diagnosis present

## 2020-10-19 DIAGNOSIS — E039 Hypothyroidism, unspecified: Secondary | ICD-10-CM

## 2020-10-19 DIAGNOSIS — Z98891 History of uterine scar from previous surgery: Secondary | ICD-10-CM

## 2020-10-19 DIAGNOSIS — O99285 Endocrine, nutritional and metabolic diseases complicating the puerperium: Secondary | ICD-10-CM

## 2020-10-19 NOTE — Progress Notes (Signed)
Post Partum Visit Note  Erika [name] is a 34 y.o. G67P2002 female who presents for a postpartum visit. She is 4 weeks postpartum following a repeat cesarean section.  I have fully reviewed the prenatal and intrapartum course. The delivery was at 39 gestational weeks.  Anesthesia: spinal. Postpartum course has been uneventful other than some lingering pain/soreness in abdomen/pubic bone.  Also has had all over itch w/o rash.  ? Dry air--try humidifier/Eucerin.  Baby is doing well. Baby is feeding by both breast and bottle - formula. Bleeding staining only. Bowel function is normal. Bladder function is normal. Patient is not sexually active. Contraception method is tubal ligation. Postpartum depression screening: negative.    Review of Systems Pertinent items are noted in HPI.   Objective:  Blood pressure 109/77, pulse 98, height 5\' 4"  (1.626 m), weight 289 lb (131.1 kg), currently breastfeeding.  General:  alert, cooperative, and no distress   Breasts:  negative  Lungs: Normal respiratory effort  Heart:  regular rate and rhythm  Abdomen: soft, non-tender, incision well healed.  Yeast looking rash under panus--keep dry and use monisat BID until gone   Vulva:  normal  Vagina: normal vagina  Cervix:  normal  Corpus: Well involuted  Adnexa:  not evaluated  Rectal Exam: no hemorrhoids        Assessment:    Normal postpartum exam. Pap smear IS DONE at today's visit.   Plan:   Essential components of care per ACOG recommendations:  1.  Mood and well being: Patient with negative depression screening today. Reviewed local resources for support.  - Patient does not use tobacco. If using tobacco we discussed reduction and for recently cessation risk of relapse - hx of drug use? No   If yes, discussed support systems  2. Infant care and feeding:  -Patient currently breastmilk feeding? Yes If breastmilk feeding discussed return to work and pumping. If needed, patient was provided letter for  work to allow for every 2-3 hr pumping breaks, and to be granted a private location to express breastmilk and refrigerated area to store breastmilk. Reviewed importance of draining breast regularly to support lactation. -Social determinants of health (SDOH) reviewed in EPIC. No concerns The following needs were identified  3. Sexuality, contraception and birth spacing - Patient does not want a pregnancy in the next year.  Desired family size is 2 children.  - Reviewed forms of contraception in tiered fashion. Patient desired bilateral tubal ligation done w/CS.   - Discussed birth spacing of 18 months  4. Sleep and fatigue -Encouraged family/partner/community support of 4 hrs of uninterrupted sleep to help with mood and fatigue  5. Physical Recovery  - Discussed patients delivery and complications.  Adhesions causing placenta to be delivered prior to baby! Patient expressed understanding - Patient has urinary incontinence?   - Patient is safe to resume physical and sexual activity  6.  Health Maintenance - Last pap smear done today   Hypothyroidism: Chronic Disease - PCP follow up  , [facility], [facility]

## 2020-10-19 NOTE — Patient Instructions (Signed)
Eucerin

## 2020-10-24 LAB — CYTOLOGY - PAP
Comment: NEGATIVE
Diagnosis: NEGATIVE
High risk HPV: NEGATIVE

## 2020-11-07 ENCOUNTER — Encounter: Payer: Self-pay | Admitting: General Practice

## 2022-05-01 ENCOUNTER — Ambulatory Visit (INDEPENDENT_AMBULATORY_CARE_PROVIDER_SITE_OTHER): Payer: 59 | Admitting: Adult Health

## 2022-05-01 ENCOUNTER — Encounter: Payer: Self-pay | Admitting: Adult Health

## 2022-05-01 VITALS — BP 121/82 | HR 98 | Ht 64.0 in | Wt 275.0 lb

## 2022-05-01 DIAGNOSIS — Z3202 Encounter for pregnancy test, result negative: Secondary | ICD-10-CM | POA: Diagnosis not present

## 2022-05-01 DIAGNOSIS — N939 Abnormal uterine and vaginal bleeding, unspecified: Secondary | ICD-10-CM | POA: Diagnosis not present

## 2022-05-01 DIAGNOSIS — N920 Excessive and frequent menstruation with regular cycle: Secondary | ICD-10-CM | POA: Diagnosis not present

## 2022-05-01 DIAGNOSIS — R102 Pelvic and perineal pain: Secondary | ICD-10-CM

## 2022-05-01 LAB — POCT URINE PREGNANCY: Preg Test, Ur: NEGATIVE

## 2022-05-01 NOTE — Progress Notes (Signed)
  Subjective:     Patient ID: Erika [name], female   DOB: [birth year], 36 y.o.   MRN: [id number]  HPI Airelle is a 36 year old white female,married, G2P2, in complaining of 2 periods in May and pelvic pressure. She recently started adderal.  Lab Results  Component Value Date   DIAGPAP  10/19/2020    - Negative for intraepithelial lesion or malignancy (NILM)   HPVHIGH Negative 10/19/2020   PCP is [facility]  Review of Systems Had 2 periods in May, 8-11 then 16-24 Has +pressure in pelvic area  Periods can be heavy at times too. Some discomfort with sex Reviewed past medical,surgical, social and family history. Reviewed medications and allergies.     Objective:   Physical Exam BP 121/82 (BP Location: Left Arm, Patient Position: Sitting, Cuff Size: Large)   Pulse 98   Ht 5\' 4"  (1.626 m)   Wt 275 lb (124.7 kg)   Breastfeeding No   BMI 47.20 kg/m  UPT is negative    Skin warm and dry.Pelvic: external genitalia is normal in appearance no lesions, vagina: brown discharge without odor,urethra has no lesions or masses noted, cervix:smooth, uterus: normal size, shape and contour, mildly tender, no masses felt, adnexa: no masses or tenderness noted. Bladder is non tender and no masses felt.  Fall risk is low  Upstream - 05/01/22 1051       Pregnancy Intention Screening   Does the patient want to become pregnant in the next year? No    Does the patient's partner want to become pregnant in the next year? No    Would the patient like to discuss contraceptive options today? No      Contraception Wrap Up   Current Method Female Sterilization    End Method Female Sterilization            Examination chaperoned by 05/03/22 LPN  Assessment:     1. Pregnancy examination or test, negative result - POCT urine pregnancy  2. Abnormal uterine bleeding (AUB) Will get pelvic Faith Rogue 05/08/22 at 10:30 am at [facility] to assess uterus and ovaries, and will talk when results back  - [facility] PELVIC  COMPLETE WITH TRANSVAGINAL; Future  3. Pelvic pressure in female  - US PELVIC COMPLETE WITH TRANSVAGINAL; Future  4. Menorrhagia with regular cycle  - US PELVIC COMPLETE WITH TRANSVAGINAL; Future     Plan:     Follow up TBD after [place] results back

## 2022-05-08 ENCOUNTER — Ambulatory Visit (HOSPITAL_COMMUNITY)
Admission: RE | Admit: 2022-05-08 | Discharge: 2022-05-08 | Disposition: A | Discharge status: Home / Self Care | Payer: 59 | Source: Ambulatory Visit | Attending: Adult Health | Admitting: Adult Health

## 2022-05-08 DIAGNOSIS — R102 Pelvic and perineal pain: Secondary | ICD-10-CM | POA: Diagnosis present

## 2022-05-08 DIAGNOSIS — N920 Excessive and frequent menstruation with regular cycle: Secondary | ICD-10-CM | POA: Insufficient documentation

## 2022-05-08 DIAGNOSIS — N939 Abnormal uterine and vaginal bleeding, unspecified: Secondary | ICD-10-CM | POA: Insufficient documentation

## 2023-01-06 DIAGNOSIS — Z6841 Body Mass Index (BMI) 40.0 and over, adult: Secondary | ICD-10-CM | POA: Diagnosis not present

## 2023-01-06 DIAGNOSIS — R059 Cough, unspecified: Secondary | ICD-10-CM | POA: Diagnosis not present

## 2023-01-06 DIAGNOSIS — J101 Influenza due to other identified influenza virus with other respiratory manifestations: Secondary | ICD-10-CM | POA: Diagnosis not present

## 2023-01-06 DIAGNOSIS — Z20828 Contact with and (suspected) exposure to other viral communicable diseases: Secondary | ICD-10-CM | POA: Diagnosis not present

## 2023-01-06 DIAGNOSIS — R509 Fever, unspecified: Secondary | ICD-10-CM | POA: Diagnosis not present

## 2023-02-11 ENCOUNTER — Institutional Professional Consult (permissible substitution): Payer: BC Managed Care – PPO | Admitting: Pulmonary Disease

## 2023-02-14 ENCOUNTER — Encounter: Payer: Self-pay | Admitting: Pulmonary Disease

## 2023-02-14 ENCOUNTER — Ambulatory Visit (INDEPENDENT_AMBULATORY_CARE_PROVIDER_SITE_OTHER): Payer: BC Managed Care – PPO | Admitting: Pulmonary Disease

## 2023-02-14 ENCOUNTER — Ambulatory Visit (INDEPENDENT_AMBULATORY_CARE_PROVIDER_SITE_OTHER)
Admission: RE | Admit: 2023-02-14 | Discharge: 2023-02-14 | Disposition: A | Discharge status: Home / Self Care | Payer: BC Managed Care – PPO | Source: Ambulatory Visit | Attending: Pulmonary Disease | Admitting: Pulmonary Disease

## 2023-02-14 VITALS — BP 126/68 | HR 99 | Ht 65.0 in | Wt 260.6 lb

## 2023-02-14 DIAGNOSIS — J4540 Moderate persistent asthma, uncomplicated: Secondary | ICD-10-CM

## 2023-02-14 DIAGNOSIS — J189 Pneumonia, unspecified organism: Secondary | ICD-10-CM | POA: Diagnosis not present

## 2023-02-14 MED ORDER — BUDESONIDE-FORMOTEROL FUMARATE 160-4.5 MCG/ACT IN AERO: 2.0000 | INHALATION_SPRAY | Freq: Two times a day (BID) | RESPIRATORY_TRACT | 11 refills | Status: AC

## 2023-02-14 NOTE — Progress Notes (Signed)
@Patient  ID: Erika [name], female    DOB: August 16, 1986, 37 y.o.   MRN: [id number]  Chief Complaint  Patient presents with   Consult    Pt is here for new consult in relation to abnormal xray. Pt states no pulmonary issues noted.     Referring provider: Rosalee Kaufman, *  HPI:   37 y.o. woman whom we are seeing for evaluation of abnormal chest x-ray.  Note from referring provider reviewed.  Patient was usual state of health.  Became quite ill first week of February.  Malaise, fever, cough, shortness of breath.  Presented to medical care.  Diagnosed with flu at that time.  Chest x-ray was obtained 01/06/2023.  On my review interpretation this reveals clear lungs bilaterally, may be a hazy small rounded opacity in bilateral lower lobes although this is also in the confluence of rib shadows are difficult to tell, read on the chest x-ray as bilateral infiltrates concerning for pneumonia.  She was treated with azithromycin, prednisone, albuterol rescue inhaler use.  Symptoms have improved.  She feels essentially back to normal.  Repeat chest x-rays obtained via PCP 01/27/2023 that on my review interpretation shows clear lungs bilaterally with improvement in possible areas of pneumonia prior.  The read on the chest x-ray was persistent infiltrates.  This prompted referral.  Again she is feeling normal.  Back to baseline breathing.  She does have a history of asthma.  Reports use of albuterol usually with flares of asthma.  This routinely happens fall, winter, spring.  Triggered by viruses but also pollens.  Relatively prolonged symptoms of bronchitis compared to other members of her household.  We discussed in detail the findings of her chest x-ray.  She was tearful fearing bad news.  She is not sure why she was sent here she is soon because of something terrible in the chest x-ray.  Reassured her that chest images are relatively normal.  Provided reassurance in terms of repeat imaging  etc.   Questionaires / Pulmonary Flowsheets:   ACT:      No data to display          MMRC:     No data to display          Epworth:      No data to display          Tests:   FENO:  No results found for: "NITRICOXIDE"  PFT:     No data to display          WALK:      No data to display          Imaging: Personally reviewed and as per EMR and discussion in this note No results found.  Lab Results: Personally reviewed CBC    Component Value Date/Time   WBC 9.7 09/16/2020 0420   RBC 3.60 (L) 09/16/2020 0420   HGB 10.6 (L) 09/16/2020 0420   HGB 11.8 06/14/2020 0916   HCT 32.3 (L) 09/16/2020 0420   HCT 35.1 06/14/2020 0916   PLT 229 09/16/2020 0420   PLT 224 06/14/2020 0916   MCV 89.7 09/16/2020 0420   MCV 88 06/14/2020 0916   MCH 29.4 09/16/2020 0420   MCHC 32.8 09/16/2020 0420   RDW 14.3 09/16/2020 0420   RDW 14.0 06/14/2020 0916   LYMPHSABS 2.2 03/23/2020 2121   LYMPHSABS 2.0 03/06/2020 1104   MONOABS 0.3 03/23/2020 2121   EOSABS 0.1 03/23/2020 2121   EOSABS 0.1 03/06/2020 1104  BASOSABS 0.0 03/23/2020 2121   BASOSABS 0.0 03/06/2020 1104    BMET    Component Value Date/Time   NA 137 09/15/2020 1439   K 4.2 09/15/2020 1439   CL 107 09/15/2020 1439   CO2 18 (L) 09/15/2020 1439   GLUCOSE 105 (H) 09/15/2020 1439   BUN 5 (L) 09/15/2020 1439   CREATININE 0.58 09/15/2020 1439   CALCIUM 9.0 09/15/2020 1439   GFRNONAA >60 09/15/2020 1439   GFRAA >60 03/23/2020 2121    BNP No results found for: "BNP"  ProBNP No results found for: "PROBNP"  Specialty Problems   None   Allergies  Allergen Reactions   Bee Venom Swelling   Doxycycline Nausea And Vomiting    Immunization History  Administered Date(s) Administered   MMR 07/24/2015   Tdap 06/12/2020    Past Medical History:  Diagnosis Date   Asthma    Infections of kidney    Infections of kidney    Kidney infection    Thyroid disease     Tobacco  History: Social History   Tobacco Use  Smoking Status Every Day   Packs/day: 1   Types: Cigarettes  Smokeless Tobacco Never  Tobacco Comments   Pt states she smoke 1/2 ppd   Ready to quit: Not Answered Counseling given: Not Answered Tobacco comments: Pt states she smoke 1/2 ppd   Continue to not smoke  Outpatient Encounter Medications as of 02/14/2023  Medication Sig   ALPRAZolam (XANAX) 0.5 MG tablet Take 0.5 mg by mouth daily.   amphetamine-dextroamphetamine (ADDERALL XR) 25 MG 24 hr capsule Take by mouth every morning.   Ascorbic Acid (VITAMIN C PO) Take by mouth.   BIOTIN PO Take by mouth.   budesonide-formoterol (SYMBICORT) 160-4.5 MCG/ACT inhaler Inhale 2 puffs into the lungs 2 (two) times daily.   Cyanocobalamin (VITAMIN B-12 PO) Take by mouth.   levothyroxine (SYNTHROID) 150 MCG tablet Take 150 mcg by mouth every morning.   loratadine (CLARITIN) 10 MG tablet Take 10 mg by mouth daily.    Omega-3 Fatty Acids (FISH OIL PO) Take by mouth.   VITAMIN D PO Take by mouth.   VITAMIN E PO Take by mouth.   No facility-administered encounter medications on file as of 02/14/2023.     Review of Systems  Review of Systems  No chest pain with exertion.  No orthopnea or PND.  Comprehensive review of systems otherwise negative. Physical Exam  BP 126/68 (BP Location: Left Arm, Patient Position: Sitting, Cuff Size: Normal)   Pulse 99   Ht 5\' 5"  (1.651 m)   Wt 260 lb 9.6 oz (118.2 kg)   SpO2 99%   BMI 43.37 kg/m   Wt Readings from Last 5 Encounters:  02/14/23 260 lb 9.6 oz (118.2 kg)  05/01/22 275 lb (124.7 kg)  10/19/20 289 lb (131.1 kg)  09/20/20 (!) 312 lb 3.2 oz (141.6 kg)  09/15/20 (!) 313 lb (142 kg)    BMI Readings from Last 5 Encounters:  02/14/23 43.37 kg/m  05/01/22 47.20 kg/m  10/19/20 49.61 kg/m  09/20/20 53.59 kg/m  09/15/20 53.73 kg/m     Physical Exam General: Sitting in chair, no acute distress Eyes: EOMI, no icterus Neck: Supple, no  JVP Pulmonary: Clear, normal work of breathing Cardiovascular: Warm, no edema Abdomen: Nondistended, bowel sounds present MSK: No synovitis, no joint effusion Neuro: Normal gait, no weakness Psych: Normal mood, full affect   Assessment & Plan:   Pneumonia: Mild opacities on chest x-ray, very mild  at present on my review.  Associate with COVID infection.  Most likely viral pneumonia versus superimposed bacterial pneumonia.  Improved with treatment of flu, Z-Pak, prednisone.  Feeling back to normal.  Interval chest x-ray in about 2-week.  01/2023 looks slightly better at my eye.  Repeat chest x-ray today to establish new baseline now several weeks out from inciting event.  Moderate persistent asthma: Using albuterol as needed.  Bronchitis 1-2 times per years.  Suspect overall poorly controlled asthma.  Start Symbicort 2 puff twice daily.  Continue albuterol as needed.   Return in about 6 months (around 08/17/2023).   Lanier Clam, MD 02/14/2023   This appointment required 61 minutes of patient care (this includes precharting, chart review, review of results, face-to-face care, etc.).

## 2023-02-14 NOTE — Progress Notes (Signed)
CXR is clear - which is great!

## 2023-02-14 NOTE — Patient Instructions (Signed)
Nice to meet you  I think the 2 chest x-rays in February looked overall okay, slight abnormalities that look improved from one to the other  Out of an abundance of caution, we will repeat a chest x-ray today to establish new baseline  For your asthma, I recommend using Symbicort 2 puffs twice a day every day.  Rinse her mouth out with water and spit after every use.  Continue albuterol as needed  Hopefully the addition of Symbicort will help minimize bronchitis symptoms or even reduce the frequency of bronchitis as well as help you day-to-day, reduce overall use of albuterol over time.  Return to clinic in 6 months or sooner if needed with Dr. Silas Flood

## 2023-02-27 DIAGNOSIS — F419 Anxiety disorder, unspecified: Secondary | ICD-10-CM | POA: Diagnosis not present

## 2023-02-27 DIAGNOSIS — E785 Hyperlipidemia, unspecified: Secondary | ICD-10-CM | POA: Diagnosis not present

## 2023-02-27 DIAGNOSIS — E039 Hypothyroidism, unspecified: Secondary | ICD-10-CM | POA: Diagnosis not present

## 2023-03-03 ENCOUNTER — Encounter: Payer: Self-pay | Admitting: Pulmonary Disease

## 2023-03-03 NOTE — Telephone Encounter (Signed)
Received a MyChart message from patient stating that she needs a PA on her Symbicort 160.   PA Team, can we please start this? Thanks!

## 2023-03-04 ENCOUNTER — Other Ambulatory Visit (HOSPITAL_COMMUNITY): Payer: Self-pay

## 2023-03-04 ENCOUNTER — Telehealth: Payer: Self-pay

## 2023-03-04 NOTE — Telephone Encounter (Signed)
PA request received via CMM for Symbicort 160-4.5MCG/ACT aerosol  PA has been submitted to [employer] and has been APPROVED from 03/04/2023-03/03/2024  Key: [id number]

## 2023-03-04 NOTE — Telephone Encounter (Signed)
PA has been approved

## 2023-08-18 DIAGNOSIS — E039 Hypothyroidism, unspecified: Secondary | ICD-10-CM | POA: Diagnosis not present

## 2023-08-18 DIAGNOSIS — F419 Anxiety disorder, unspecified: Secondary | ICD-10-CM | POA: Diagnosis not present

## 2023-08-18 DIAGNOSIS — E785 Hyperlipidemia, unspecified: Secondary | ICD-10-CM | POA: Diagnosis not present

## 2023-09-25 DIAGNOSIS — J00 Acute nasopharyngitis [common cold]: Secondary | ICD-10-CM | POA: Diagnosis not present

## 2023-09-25 DIAGNOSIS — R070 Pain in throat: Secondary | ICD-10-CM | POA: Diagnosis not present

## 2023-09-25 DIAGNOSIS — Z20822 Contact with and (suspected) exposure to covid-19: Secondary | ICD-10-CM | POA: Diagnosis not present

## 2023-10-28 DIAGNOSIS — F419 Anxiety disorder, unspecified: Secondary | ICD-10-CM | POA: Diagnosis not present

## 2023-10-28 DIAGNOSIS — E785 Hyperlipidemia, unspecified: Secondary | ICD-10-CM | POA: Diagnosis not present

## 2023-10-28 DIAGNOSIS — E039 Hypothyroidism, unspecified: Secondary | ICD-10-CM | POA: Diagnosis not present

## 2023-12-09 DIAGNOSIS — M79672 Pain in left foot: Secondary | ICD-10-CM | POA: Diagnosis not present

## 2023-12-12 DIAGNOSIS — Z1159 Encounter for screening for other viral diseases: Secondary | ICD-10-CM | POA: Diagnosis not present

## 2023-12-12 DIAGNOSIS — Z114 Encounter for screening for human immunodeficiency virus [HIV]: Secondary | ICD-10-CM | POA: Diagnosis not present

## 2023-12-12 DIAGNOSIS — W461XXA Contact with contaminated hypodermic needle, initial encounter: Secondary | ICD-10-CM | POA: Diagnosis not present

## 2024-01-06 IMAGING — US US PELVIS COMPLETE WITH TRANSVAGINAL
1 series · 13 of 25 positions shown · non-contrast
Comparison: 08/22/2020

CLINICAL DATA: Abnormal uterine bleeding, pressure, menorrhagia,
history of Caesarean section and tubal ligation, LMP 04/16/2022



[Series 1: us pelvic complete with transvaginal · 107 acquisitions, 13 frames shown]
[im 1/107]
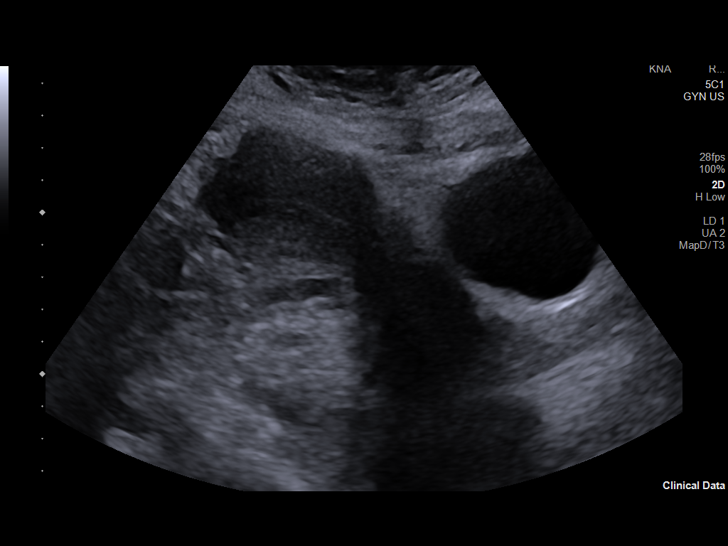
[im 9/107]
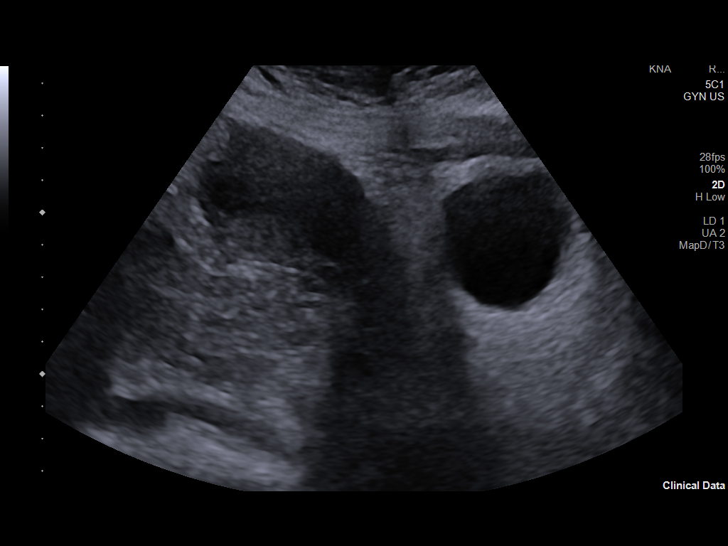
[im 18/107]
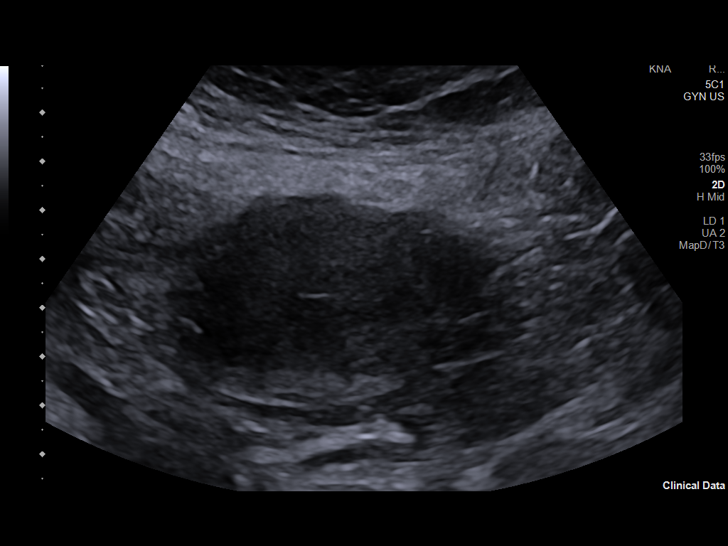
[im 27/107]
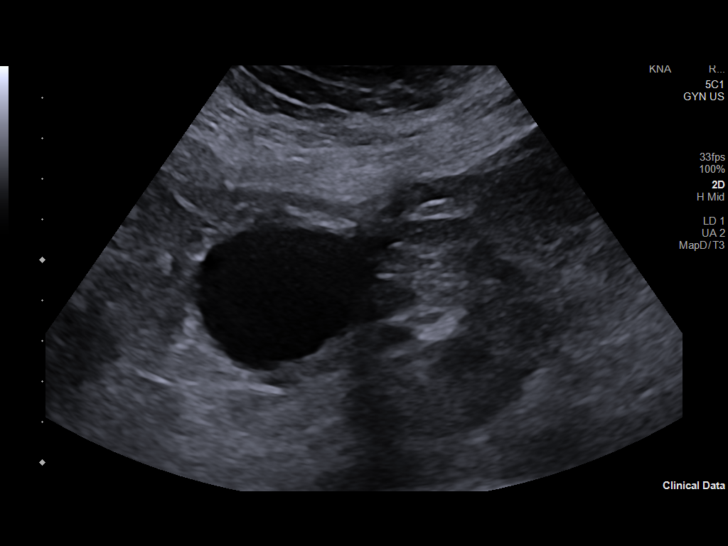
[im 36/107]
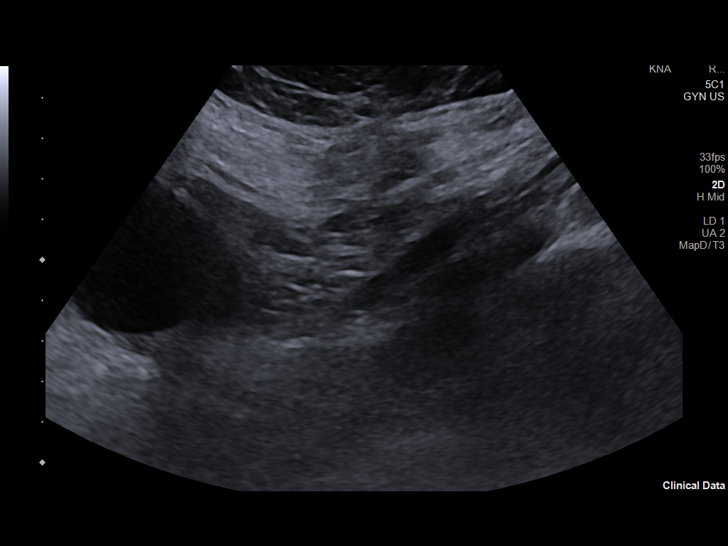
[im 45/107]
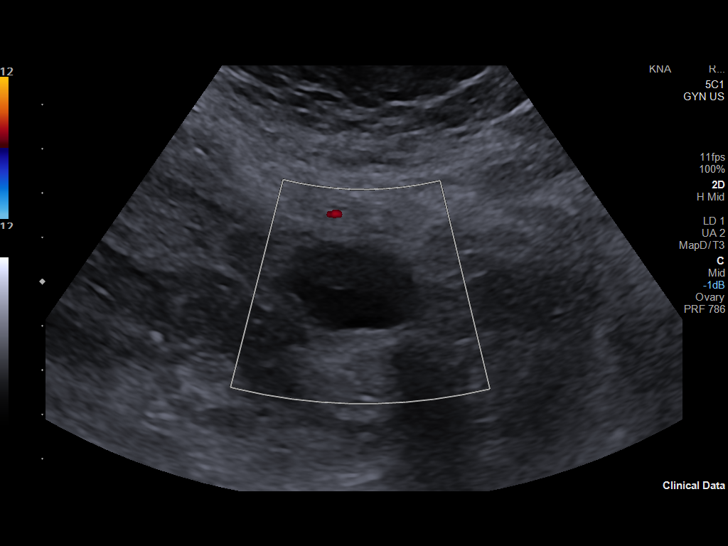
[im 54/107]
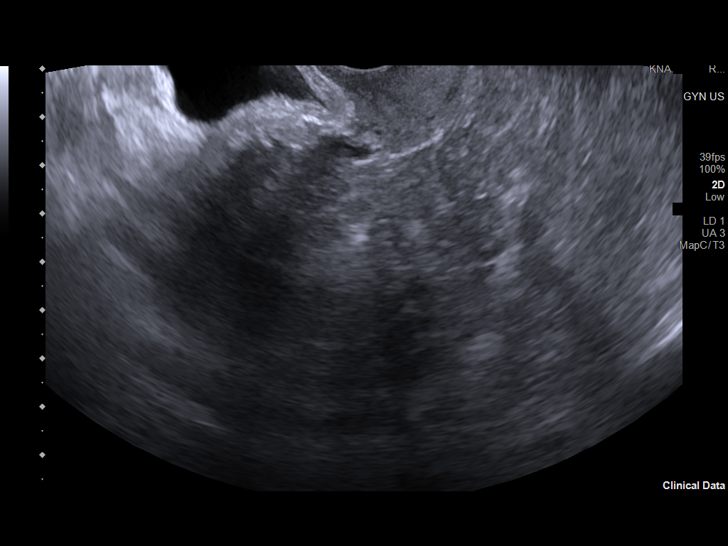
[im 62/107]
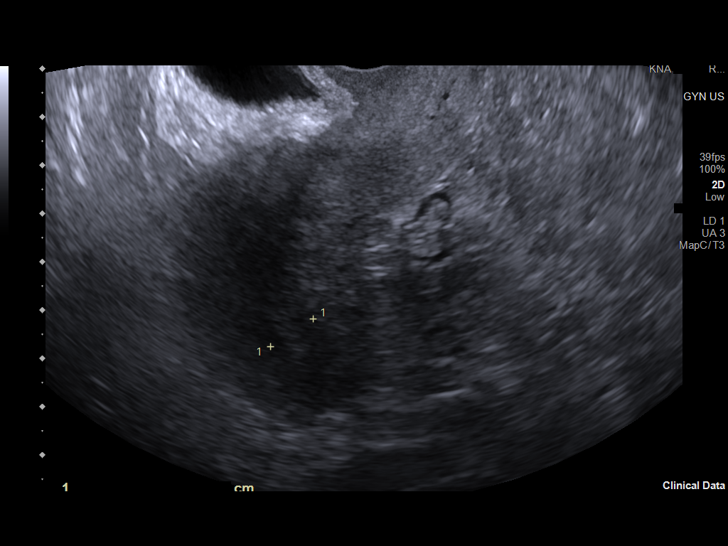
[im 71/107]
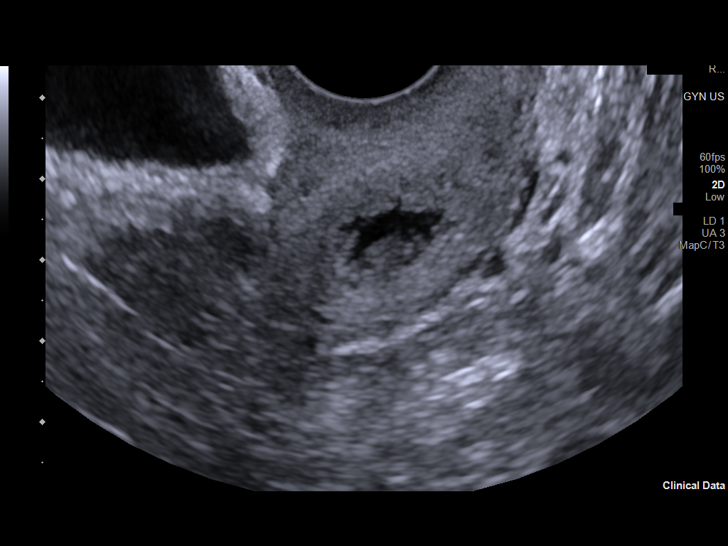
[im 80/107]
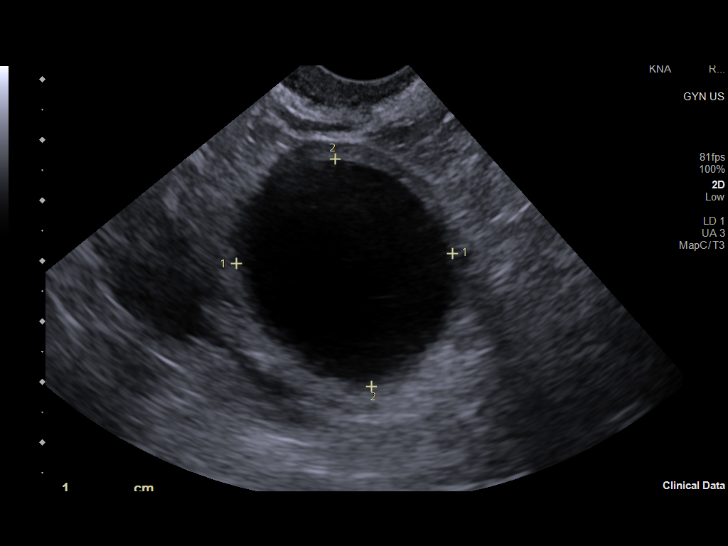
[im 89/107]
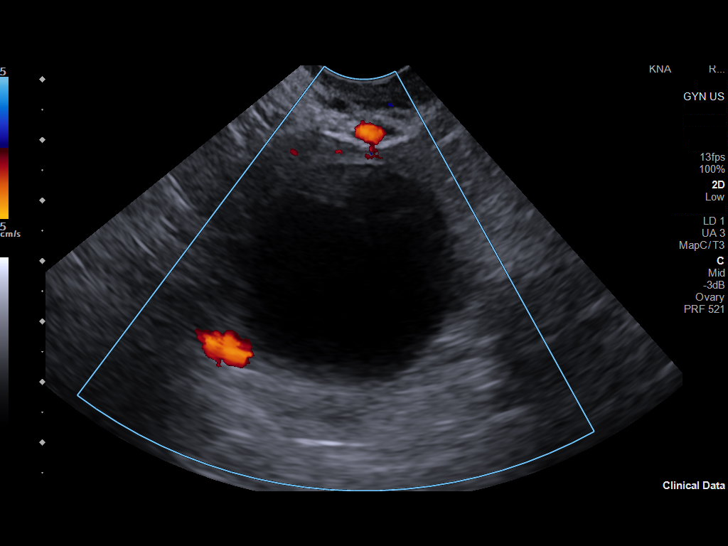
[im 98/107]
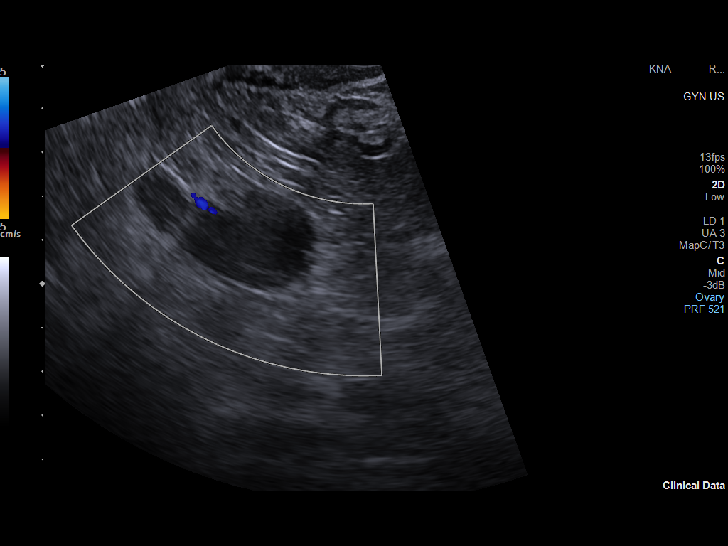
[im 107/107]
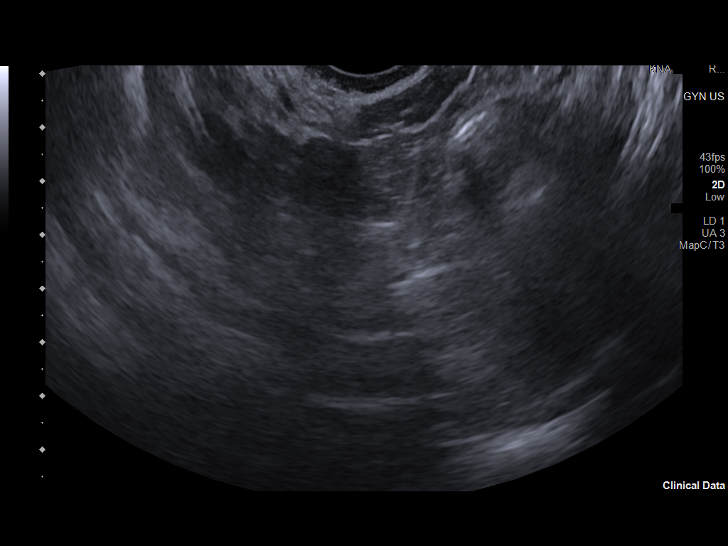

[13 of 25 positions shown; findings below may reference images not displayed]

FINDINGS: Uterus

Measurements: 11.8 x 3.7 x 6.2 cm = volume: 141 mL. Retroflexed.
Normal morphology without mass.

Endometrium

Thickness: 11 mm. No endometrial fluid or mass. Small amount of
nonspecific fluid within endocervical canal.

Right ovary

Measurements: 4.5 x 3.9 x 4.5 cm = volume: 41.1 mL. Simple cyst
RIGHT ovary 3.8 cm greatest diameter No follow up imaging
recommended. Note: This recommendation does not apply to
premenarchal patients or to those with increased risk (genetic,
family history, elevated tumor markers or other high-risk factors)
of ovarian cancer. Reference: Radiology [DATE]):[id number].

Left ovary

Measurements: 3.2 x 2.4 x 3.5 cm = volume: 14.1 mL. Normal
morphology without mass

Other findings

No free pelvic fluid or additional adnexal masses.
IMPRESSION: No significant pelvic sonographic abnormalities.

Incidental findings as above.

## 2024-01-27 DIAGNOSIS — E039 Hypothyroidism, unspecified: Secondary | ICD-10-CM | POA: Diagnosis not present

## 2024-01-27 DIAGNOSIS — R5383 Other fatigue: Secondary | ICD-10-CM | POA: Diagnosis not present

## 2024-03-08 DIAGNOSIS — S66911A Strain of unspecified muscle, fascia and tendon at wrist and hand level, right hand, initial encounter: Secondary | ICD-10-CM | POA: Diagnosis not present

## 2024-03-08 DIAGNOSIS — R030 Elevated blood-pressure reading, without diagnosis of hypertension: Secondary | ICD-10-CM | POA: Diagnosis not present

## 2024-03-11 DIAGNOSIS — M79641 Pain in right hand: Secondary | ICD-10-CM | POA: Diagnosis not present

## 2024-03-11 DIAGNOSIS — M25531 Pain in right wrist: Secondary | ICD-10-CM | POA: Diagnosis not present

## 2024-03-23 DIAGNOSIS — M654 Radial styloid tenosynovitis [de Quervain]: Secondary | ICD-10-CM | POA: Diagnosis not present

## 2024-04-29 DIAGNOSIS — E039 Hypothyroidism, unspecified: Secondary | ICD-10-CM | POA: Diagnosis not present

## 2024-04-29 DIAGNOSIS — M2550 Pain in unspecified joint: Secondary | ICD-10-CM | POA: Diagnosis not present

## 2024-05-24 DIAGNOSIS — M654 Radial styloid tenosynovitis [de Quervain]: Secondary | ICD-10-CM | POA: Diagnosis not present

## 2024-05-27 DIAGNOSIS — M654 Radial styloid tenosynovitis [de Quervain]: Secondary | ICD-10-CM | POA: Diagnosis not present

## 2024-06-01 HISTORY — PX: WRIST SURGERY: SHX841

## 2024-06-30 DIAGNOSIS — M25531 Pain in right wrist: Secondary | ICD-10-CM | POA: Diagnosis not present

## 2024-06-30 DIAGNOSIS — M25641 Stiffness of right hand, not elsewhere classified: Secondary | ICD-10-CM | POA: Diagnosis not present

## 2024-06-30 DIAGNOSIS — M25631 Stiffness of right wrist, not elsewhere classified: Secondary | ICD-10-CM | POA: Diagnosis not present

## 2024-07-07 DIAGNOSIS — M25631 Stiffness of right wrist, not elsewhere classified: Secondary | ICD-10-CM | POA: Diagnosis not present

## 2024-07-07 DIAGNOSIS — M25531 Pain in right wrist: Secondary | ICD-10-CM | POA: Diagnosis not present

## 2024-07-07 DIAGNOSIS — M25641 Stiffness of right hand, not elsewhere classified: Secondary | ICD-10-CM | POA: Diagnosis not present

## 2024-07-09 DIAGNOSIS — G5601 Carpal tunnel syndrome, right upper limb: Secondary | ICD-10-CM | POA: Diagnosis not present

## 2024-08-10 DIAGNOSIS — R0981 Nasal congestion: Secondary | ICD-10-CM | POA: Diagnosis not present

## 2024-08-10 DIAGNOSIS — J189 Pneumonia, unspecified organism: Secondary | ICD-10-CM | POA: Diagnosis not present

## 2024-08-10 DIAGNOSIS — Z6841 Body Mass Index (BMI) 40.0 and over, adult: Secondary | ICD-10-CM | POA: Diagnosis not present

## 2024-08-10 DIAGNOSIS — R030 Elevated blood-pressure reading, without diagnosis of hypertension: Secondary | ICD-10-CM | POA: Diagnosis not present

## 2024-09-27 DIAGNOSIS — M9905 Segmental and somatic dysfunction of pelvic region: Secondary | ICD-10-CM | POA: Diagnosis not present

## 2024-09-27 DIAGNOSIS — M5441 Lumbago with sciatica, right side: Secondary | ICD-10-CM | POA: Diagnosis not present

## 2024-09-27 DIAGNOSIS — M25551 Pain in right hip: Secondary | ICD-10-CM | POA: Diagnosis not present

## 2024-09-27 DIAGNOSIS — M9903 Segmental and somatic dysfunction of lumbar region: Secondary | ICD-10-CM | POA: Diagnosis not present

## 2024-10-01 DIAGNOSIS — M9903 Segmental and somatic dysfunction of lumbar region: Secondary | ICD-10-CM | POA: Diagnosis not present

## 2024-10-01 DIAGNOSIS — M9905 Segmental and somatic dysfunction of pelvic region: Secondary | ICD-10-CM | POA: Diagnosis not present

## 2024-10-01 DIAGNOSIS — M5441 Lumbago with sciatica, right side: Secondary | ICD-10-CM | POA: Diagnosis not present

## 2024-10-01 DIAGNOSIS — M25551 Pain in right hip: Secondary | ICD-10-CM | POA: Diagnosis not present

## 2024-10-08 DIAGNOSIS — M9905 Segmental and somatic dysfunction of pelvic region: Secondary | ICD-10-CM | POA: Diagnosis not present

## 2024-10-08 DIAGNOSIS — M25551 Pain in right hip: Secondary | ICD-10-CM | POA: Diagnosis not present

## 2024-10-08 DIAGNOSIS — M5441 Lumbago with sciatica, right side: Secondary | ICD-10-CM | POA: Diagnosis not present

## 2024-10-08 DIAGNOSIS — M9903 Segmental and somatic dysfunction of lumbar region: Secondary | ICD-10-CM | POA: Diagnosis not present

## 2024-11-29 ENCOUNTER — Ambulatory Visit: Admitting: Adult Health

## 2024-11-29 ENCOUNTER — Other Ambulatory Visit (HOSPITAL_COMMUNITY)
Admission: RE | Admit: 2024-11-29 | Discharge: 2024-11-29 | Disposition: A | Discharge status: Home / Self Care | Source: Ambulatory Visit | Attending: Adult Health | Admitting: Adult Health

## 2024-11-29 ENCOUNTER — Encounter: Payer: Self-pay | Admitting: Adult Health

## 2024-11-29 VITALS — BP 117/83 | HR 114 | Ht 65.0 in | Wt 290.0 lb

## 2024-11-29 DIAGNOSIS — Z124 Encounter for screening for malignant neoplasm of cervix: Secondary | ICD-10-CM | POA: Insufficient documentation

## 2024-11-29 DIAGNOSIS — R1031 Right lower quadrant pain: Secondary | ICD-10-CM | POA: Diagnosis not present

## 2024-11-29 DIAGNOSIS — N941 Unspecified dyspareunia: Secondary | ICD-10-CM | POA: Diagnosis not present

## 2024-11-29 DIAGNOSIS — R102 Pelvic and perineal pain unspecified side: Secondary | ICD-10-CM | POA: Insufficient documentation

## 2024-11-29 LAB — POCT URINALYSIS DIPSTICK
Blood, UA: NEGATIVE
Glucose, UA: NEGATIVE
Ketones, UA: NEGATIVE
Leukocytes, UA: NEGATIVE
Nitrite, UA: NEGATIVE
Protein, UA: NEGATIVE

## 2024-11-29 MED ORDER — KETOROLAC TROMETHAMINE 10 MG PO TABS: 10.0000 mg | ORAL_TABLET | Freq: Four times a day (QID) | ORAL | 0 refills | Status: AC | PRN

## 2024-11-29 NOTE — Progress Notes (Signed)
" °  Subjective:     Patient ID: Erika [name], female   DOB: Sep 17, 1986, 38 y.o.   MRN: [id number]  HPI Erika [name] is a 38 year old white female, married, G2P2002, in complaining of abdominal pain low for about 1.5 months and RLQ pain that was extreme this morning, not as bad now, and has pelvic pressure. Had discomfort after peeing and BM. Tylenol  and advil  no help.  Denies any fever, and she needs a pap. She says she has adhesions.  PCP is [facility].  Review of Systems +abdominal pain low for about 1.5 months and + RLQ pain that was extreme this morning, not as bad now,  +pelvic pressure. Had discomfort after peeing and BM. Tylenol  and advil  no help.  Has pain with sex too  Denies any fever Reviewed past medical,surgical, social and family history. Reviewed medications and allergies.     Objective:   Physical Exam BP 117/83 (BP Location: Right Arm, Patient Position: Sitting, Cuff Size: Large)   Pulse (!) 114   Ht 5' 5 (1.651 m)   Wt 290 lb (131.5 kg)   LMP 11/02/2024 (Exact Date)   BMI 48.26 kg/m  urine dipstick negative Skin warm and dry.Pelvic: external genitalia is normal in appearance no lesions, vagina: white discharge without odor,urethra has no lesions or masses noted, cervix:smooth, pap with HR HPV genotyping performed, uterus: normal size, shape and contour, mildly tender, no masses felt, adnexa: no masses, +RLQ tenderness noted. Bladder is non tender and no masses felt.   Fall risk is low  Upstream - 11/29/24 1559       Pregnancy Intention Screening   Does the patient want to become pregnant in the next year? No    Does the patient's partner want to become pregnant in the next year? No    Would the patient like to discuss contraceptive options today? No      Contraception Wrap Up   Current Method Female Sterilization    End Method Female Sterilization    Contraception Counseling Provided No         Examination chaperoned by Clarita Salt LPN  Assessment:     1.  Routine Papanicolaou smear Pap sent Pap in 3 years if negative  - Cytology - PAP( [place])  2. Pelvic pain (Primary) Has had low abd pain for 1.5 months  Had RLQ pain this morning that she was was extreme, not as bad Will get US  12/01/24 at 3:30 pm at [facility] to assess uterus and ovaries Will rx toradol  for pain, do not take with any other NSAIDS Meds ordered this encounter  Medications   ketorolac  (TORADOL ) 10 MG tablet    Sig: Take 1 tablet (10 mg total) by mouth every 6 (six) hours as needed.    Dispense:  20 tablet    Refill:  0    Supervising Provider:   JAYNE, LUTHER H [[id number]]   IF pain increases or fever or nausea/vomiting, go to ER  - POCT Urinalysis Dipstick - US  PELVIC COMPLETE WITH TRANSVAGINAL; Future  3. Pelvic pressure in female Will get US  12/01/24 at 3:30 pm at [facility] to assess uterus and ovaries  - US  PELVIC COMPLETE WITH TRANSVAGINAL; Future  4. Dyspareunia, female      Plan:     Follow up with me 12/08/24, she is off this day     "

## 2024-12-01 ENCOUNTER — Ambulatory Visit (HOSPITAL_COMMUNITY): Admission: RE | Admit: 2024-12-01 | Discharge: 2024-12-01 | Attending: Adult Health | Admitting: Adult Health

## 2024-12-01 DIAGNOSIS — R102 Pelvic and perineal pain unspecified side: Secondary | ICD-10-CM | POA: Diagnosis not present

## 2024-12-01 LAB — CYTOLOGY - PAP
Adequacy: ABSENT
Comment: NEGATIVE
Diagnosis: NEGATIVE
High risk HPV: NEGATIVE

## 2024-12-06 ENCOUNTER — Ambulatory Visit: Payer: Self-pay | Admitting: Adult Health

## 2024-12-08 ENCOUNTER — Encounter: Payer: Self-pay | Admitting: Adult Health

## 2024-12-08 ENCOUNTER — Ambulatory Visit: Admitting: Adult Health

## 2024-12-08 VITALS — BP 106/74 | HR 90 | Ht 65.0 in | Wt 290.0 lb

## 2024-12-08 DIAGNOSIS — R102 Pelvic and perineal pain unspecified side: Secondary | ICD-10-CM | POA: Diagnosis not present

## 2024-12-08 NOTE — Progress Notes (Signed)
" °  Subjective:     Patient ID: Erika [name], female   DOB: 1986/05/23, 39 y.o.   MRN: [id number]  HPI Erika [name] is a 39 year old white female, married, G2P2002, back in follow up in pelvic pain and pressure, and still has pain but not as bad and still has pressure, esp with beginning of stream of urine, the pain is more to the right and in to the groin area. She has US  12/01/24, it was not read yet so called and they did read it as normal, had some fluid in endometrium. The pain is worse when on her feet.     Component Value Date/Time   DIAGPAP  11/29/2024 1605    - Negative for intraepithelial lesion or malignancy (NILM)   DIAGPAP  10/19/2020 1436    - Negative for intraepithelial lesion or malignancy (NILM)   HPVHIGH Negative 11/29/2024 1605   HPVHIGH Negative 10/19/2020 1436   ADEQPAP  11/29/2024 1605    Satisfactory for evaluation; transformation zone component ABSENT.   ADEQPAP  10/19/2020 1436    Satisfactory for evaluation; transformation zone component PRESENT.     Review of Systems +pelvic pain still, not as bad    +pressure esp with beginning stream of urine, not like UTI Reviewed past medical,surgical, social and family history. Reviewed medications and allergies.  Objective:   Physical Exam BP 106/74 (BP Location: Right Arm, Patient Position: Sitting, Cuff Size: Large)   Pulse 90   Ht 5' 5 (1.651 m)   Wt 290 lb (131.5 kg)   LMP 12/02/2024 (Exact Date)   BMI 48.26 kg/m     Skin warm and dry.  Lungs: clear to ausculation bilaterally. Cardiovascular: regular rate and rhythm.  Pt aware of US  results.   Upstream - 12/08/24 1600       Pregnancy Intention Screening   Does the patient want to become pregnant in the next year? No    Does the patient's partner want to become pregnant in the next year? No    Would the patient like to discuss contraceptive options today? No      Contraception Wrap Up   Current Method Female Sterilization    End Method Female Sterilization     Contraception Counseling Provided No          Assessment:     1. Pelvic pain (Primary) Still has pain, not as bad, more to right and into groin US  was normal  2. Pelvic pressure in female Still has pressure, esp at beginning of urine stream     Plan:     Return to see Dr Jayne 12/20/24     "

## 2024-12-20 ENCOUNTER — Ambulatory Visit: Admitting: Obstetrics & Gynecology

## 2024-12-20 ENCOUNTER — Encounter: Payer: Self-pay | Admitting: Obstetrics & Gynecology

## 2024-12-20 VITALS — BP 127/85 | HR 91 | Ht 65.0 in | Wt 290.0 lb

## 2024-12-20 DIAGNOSIS — Z98891 History of uterine scar from previous surgery: Secondary | ICD-10-CM | POA: Diagnosis not present

## 2024-12-20 DIAGNOSIS — N946 Dysmenorrhea, unspecified: Secondary | ICD-10-CM

## 2024-12-20 DIAGNOSIS — R1031 Right lower quadrant pain: Secondary | ICD-10-CM

## 2024-12-20 DIAGNOSIS — N920 Excessive and frequent menstruation with regular cycle: Secondary | ICD-10-CM | POA: Diagnosis not present

## 2024-12-20 MED ORDER — MEGESTROL ACETATE 40 MG PO TABS: 40.0000 mg | ORAL_TABLET | Freq: Every day | ORAL | 3 refills | Status: AC

## 2024-12-20 NOTE — Progress Notes (Signed)
 Follow up appointment f sonogram  Chief Complaint  Patient presents with   Follow-up    Pelvic pain    Blood pressure 127/85, pulse 91, height 5' 5 (1.651 m), weight 290 lb (131.5 kg), last menstrual period 12/02/2024.  CLINICAL DATA:  Pelvic pain pelvic pressure   EXAM: TRANSABDOMINAL AND TRANSVAGINAL ULTRASOUND OF PELVIS   TECHNIQUE: Both transabdominal and transvaginal ultrasound examinations of the pelvis were performed. Transabdominal technique was performed for global imaging of the pelvis including uterus, ovaries, adnexal regions, and pelvic cul-de-sac. It was necessary to proceed with endovaginal exam following the transabdominal exam to visualize the uterus and adnexa.   COMPARISON:  Ultrasound 05/08/2022   FINDINGS: Uterus   Measurements: 10.5 x 4.5 x 5.7 cm = volume: 138.1 mL. No fibroids or other mass visualized.   Endometrium   Thickness: 4.7 mm. Small amount of fluid within the fundal endometrial canal.   Right ovary   Measurements: 2.4 x 2.3 x 2.7 cm = volume: 7.8 mL. Normal appearance/no adnexal mass.   Left ovary   Measurements: 2.9 x 2.2 x 2.9 cm = volume: 9.6 mL. Normal appearance/no adnexal mass.   Other findings   No abnormal free fluid.   IMPRESSION: Small amount of fluid within the fundal endometrial canal. Otherwise negative pelvic ultrasound.     Electronically Signed   By: Luke Bun M.D.   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    Dispense:  30 tablet    Refill:  3    Orders for this encounter: No orders of the defined types were placed in this encounter.   Impression + Management Plan   ICD-10-CM   1. RLQ abdominal pain: episodic, cyclical, feels  like bowling ball in corner of C section scar  R10.31    trial of progesterone, follow up 2 months    2. Menorrhagia with regular cycle  N92.0     3. Dysmenorrhea  N94.6       Follow Up: Return in about 2 months (around 02/17/2025) for Follow up, with Dr Jayne.     All questions were answered.  Past Medical History:  Diagnosis Date   Asthma    Infections of kidney    Infections of kidney    Kidney infection    Thyroid  disease     Past Surgical History:  Procedure Laterality Date   CESAREAN SECTION     CESAREAN SECTION WITH BILATERAL TUBAL LIGATION N/A 09/15/2020   Procedure: CESAREAN SECTION WITH BILATERAL TUBAL LIGATION;  Surgeon: Jayne Vonn DEL, MD;  Location: [facility] ORS;  Service: Obstetrics;  Laterality: N/A;   URETHRAL DILATION     WRIST SURGERY Right 06/2024    OB History     Gravida  2   Para  2   Term  2   Preterm      AB      Living  2      SAB      IAB      Ectopic      Multiple  0   Live Births  2           Allergies[1]  Social History   Socioeconomic History   Marital  status: Married    Spouse name: Chaundra Abreu   Number of children: Not on file   Years of education: Not on file   Highest education level: Not on file  Occupational History   Not on file  Tobacco Use   Smoking status: Former    Current packs/day: 1.00    Types: Cigarettes   Smokeless tobacco: Never   Tobacco comments:    Pt states she smoke 1/2 ppd  Vaping Use   Vaping status: Never Used  Substance and Sexual Activity   Alcohol use: No   Drug use: No   Sexual activity: Yes    Birth control/protection: Surgical    Comment: tubal  Other Topics Concern   Not on file  Social History Narrative   Not on file   Social Drivers of Health   Tobacco Use: Medium Risk (12/20/2024)   Patient History    Smoking Tobacco Use: Former    Smokeless Tobacco Use: Never    Passive Exposure: Not on Actuary Strain: Not on file  Food Insecurity:  Not on file  Transportation Needs: Not on file  Physical Activity: Not on file  Stress: Not on file  Social Connections: Not on file  Depression (EYV7-0): Not on file  Alcohol Screen: Not on file  Housing: Not on file  Utilities: Not on file  Health Literacy: Not on file    Family History  Problem Relation Age of Onset   Thyroid  disease Father        [1]  Allergies Allergen Reactions   Bee Venom Swelling   Doxycycline Nausea And Vomiting

## 2024-12-22 ENCOUNTER — Encounter: Payer: Self-pay | Admitting: Obstetrics & Gynecology

## 2025-02-17 ENCOUNTER — Ambulatory Visit: Admitting: Obstetrics & Gynecology
